# Patient Record
Sex: Male | Born: 1945 | Race: White | Hispanic: No | Marital: Married | Smoking: Never smoker
Health system: Southern US, Community
[De-identification: ages and names within clinical notes are randomized; demographics above are authoritative.]

## PROBLEM LIST (undated history)

## (undated) DIAGNOSIS — Z8719 Personal history of other diseases of the digestive system: Secondary | ICD-10-CM

## (undated) DIAGNOSIS — M1990 Unspecified osteoarthritis, unspecified site: Secondary | ICD-10-CM

## (undated) DIAGNOSIS — N12 Tubulo-interstitial nephritis, not specified as acute or chronic: Secondary | ICD-10-CM

## (undated) DIAGNOSIS — N529 Male erectile dysfunction, unspecified: Secondary | ICD-10-CM

## (undated) DIAGNOSIS — N419 Inflammatory disease of prostate, unspecified: Secondary | ICD-10-CM

## (undated) DIAGNOSIS — Q245 Malformation of coronary vessels: Secondary | ICD-10-CM

## (undated) DIAGNOSIS — N486 Induration penis plastica: Secondary | ICD-10-CM

## (undated) DIAGNOSIS — E785 Hyperlipidemia, unspecified: Secondary | ICD-10-CM

## (undated) DIAGNOSIS — D126 Benign neoplasm of colon, unspecified: Secondary | ICD-10-CM

## (undated) HISTORY — PX: LAPAROTOMY: SHX154

## (undated) HISTORY — DX: Benign neoplasm of colon, unspecified: D12.6

## (undated) HISTORY — DX: Tubulo-interstitial nephritis, not specified as acute or chronic: N12

## (undated) HISTORY — DX: Malformation of coronary vessels: Q24.5

## (undated) HISTORY — DX: Inflammatory disease of prostate, unspecified: N41.9

## (undated) HISTORY — PX: TONSILLECTOMY: SUR1361

## (undated) HISTORY — DX: Unspecified osteoarthritis, unspecified site: M19.90

## (undated) HISTORY — DX: Personal history of other diseases of the digestive system: Z87.19

## (undated) HISTORY — PX: OTHER SURGICAL HISTORY: SHX169

## (undated) HISTORY — PX: POLYPECTOMY: SHX149

---

## 1993-04-30 DIAGNOSIS — Q245 Malformation of coronary vessels: Secondary | ICD-10-CM

## 1993-04-30 HISTORY — DX: Malformation of coronary vessels: Q24.5

## 2006-06-25 ENCOUNTER — Ambulatory Visit: Payer: Self-pay | Admitting: Internal Medicine

## 2006-06-25 LAB — CONVERTED CEMR LAB
ALT: 50 units/L — ABNORMAL HIGH (ref 0–40)
Alkaline Phosphatase: 53 units/L (ref 39–117)
Basophils Relative: 1.5 % — ABNORMAL HIGH (ref 0.0–1.0)
Bilirubin, Direct: 0.2 mg/dL (ref 0.0–0.3)
CO2: 30 meq/L (ref 19–32)
Creatinine, Ser: 1 mg/dL (ref 0.4–1.5)
Direct LDL: 123.1 mg/dL
Eosinophils Relative: 3.1 % (ref 0.0–5.0)
GFR calc Af Amer: 98 mL/min
Glucose, Bld: 98 mg/dL (ref 70–99)
HCT: 42 % (ref 39.0–52.0)
HDL: 68.9 mg/dL (ref >39.0)
Hemoglobin: 14.9 g/dL (ref 13.0–17.0)
Leukocytes, UA: NEGATIVE
Lymphocytes Relative: 25 % (ref 12.0–46.0)
Monocytes Absolute: 0.4 10*3/uL (ref 0.2–0.7)
Neutro Abs: 2.5 10*3/uL (ref 1.4–7.7)
Nitrite: NEGATIVE
Potassium: 4.3 meq/L (ref 3.5–5.1)
RDW: 12.4 % (ref 11.5–14.6)
Specific Gravity, Urine: >=1.03 (ref 1.000–1.03)
TSH: 1.3 microintl units/mL (ref 0.35–5.50)
Total Bilirubin: 1.2 mg/dL (ref 0.3–1.2)
Total Protein, Urine: NEGATIVE mg/dL
Total Protein: 6.6 g/dL (ref 6.0–8.3)
Urobilinogen, UA: 0.2 (ref 0.0–1.0)
WBC: 4.1 10*3/uL — ABNORMAL LOW (ref 4.5–10.5)
pH: 5.5 (ref 5.0–8.0)

## 2006-07-02 ENCOUNTER — Ambulatory Visit: Payer: Self-pay | Admitting: Internal Medicine

## 2006-07-16 ENCOUNTER — Ambulatory Visit: Payer: Self-pay | Admitting: Internal Medicine

## 2006-07-30 ENCOUNTER — Ambulatory Visit: Payer: Self-pay | Admitting: Internal Medicine

## 2007-09-06 ENCOUNTER — Encounter: Payer: Self-pay | Admitting: *Deleted

## 2007-09-06 DIAGNOSIS — Z860100 Personal history of colon polyps, unspecified: Secondary | ICD-10-CM | POA: Insufficient documentation

## 2007-09-06 DIAGNOSIS — K5730 Diverticulosis of large intestine without perforation or abscess without bleeding: Secondary | ICD-10-CM | POA: Insufficient documentation

## 2007-09-06 DIAGNOSIS — K648 Other hemorrhoids: Secondary | ICD-10-CM | POA: Insufficient documentation

## 2007-09-06 DIAGNOSIS — Z87448 Personal history of other diseases of urinary system: Secondary | ICD-10-CM | POA: Insufficient documentation

## 2007-09-06 DIAGNOSIS — Z9189 Other specified personal risk factors, not elsewhere classified: Secondary | ICD-10-CM | POA: Insufficient documentation

## 2008-08-10 ENCOUNTER — Telehealth: Payer: Self-pay | Admitting: Internal Medicine

## 2008-08-10 ENCOUNTER — Ambulatory Visit: Payer: Self-pay | Admitting: Internal Medicine

## 2009-01-04 ENCOUNTER — Ambulatory Visit: Payer: Self-pay | Admitting: Internal Medicine

## 2009-01-04 LAB — CONVERTED CEMR LAB
ALT: 46 units/L (ref 0–53)
AST: 31 units/L (ref 0–37)
Albumin: 4.1 g/dL (ref 3.5–5.2)
BUN: 12 mg/dL (ref 6–23)
Basophils Relative: 0.2 % (ref 0.0–3.0)
Bilirubin Urine: NEGATIVE
Chloride: 106 meq/L (ref 96–112)
Eosinophils Relative: 2.8 % (ref 0.0–5.0)
GFR calc non Af Amer: 80.48 mL/min (ref >60)
HCT: 44.6 % (ref 39.0–52.0)
HDL: 65.2 mg/dL (ref >39.00)
Hemoglobin, Urine: NEGATIVE
Hemoglobin: 15.3 g/dL (ref 13.0–17.0)
Ketones, ur: NEGATIVE mg/dL
Leukocytes, UA: NEGATIVE
Lymphs Abs: 1.2 10*3/uL (ref 0.7–4.0)
MCV: 97.8 fL (ref 78.0–100.0)
Monocytes Absolute: 0.3 10*3/uL (ref 0.1–1.0)
Monocytes Relative: 8.4 % (ref 3.0–12.0)
Neutro Abs: 2.4 10*3/uL (ref 1.4–7.7)
Potassium: 4.3 meq/L (ref 3.5–5.1)
Sodium: 141 meq/L (ref 135–145)
TSH: 1.31 microintl units/mL (ref 0.35–5.50)
Total Bilirubin: 1 mg/dL (ref 0.3–1.2)
Total Protein: 7.1 g/dL (ref 6.0–8.3)
VLDL: 20 mg/dL (ref 0.0–40.0)
WBC: 4 10*3/uL — ABNORMAL LOW (ref 4.5–10.5)
pH: 5.5 (ref 5.0–8.0)

## 2009-01-14 ENCOUNTER — Ambulatory Visit: Payer: Self-pay | Admitting: Internal Medicine

## 2010-08-21 ENCOUNTER — Ambulatory Visit (INDEPENDENT_AMBULATORY_CARE_PROVIDER_SITE_OTHER): Payer: BC Managed Care – PPO | Admitting: Internal Medicine

## 2010-08-21 ENCOUNTER — Encounter: Payer: Self-pay | Admitting: Internal Medicine

## 2010-08-21 VITALS — BP 120/74 | HR 55 | Temp 97.9°F | Ht 71.0 in | Wt 184.0 lb

## 2010-08-21 DIAGNOSIS — R309 Painful micturition, unspecified: Secondary | ICD-10-CM

## 2010-08-21 DIAGNOSIS — Z0000 Encounter for general adult medical examination without abnormal findings: Secondary | ICD-10-CM

## 2010-08-21 DIAGNOSIS — R300 Dysuria: Secondary | ICD-10-CM

## 2010-08-21 LAB — POCT URINALYSIS DIPSTICK
Bilirubin, UA: NEGATIVE
Ketones, UA: NEGATIVE
Leukocytes, UA: NEGATIVE
Spec Grav, UA: 1.025
pH, UA: 5

## 2010-08-21 NOTE — Progress Notes (Signed)
  Subjective:    Patient ID: John Trujillo, male    DOB: 12-08-1945, 65 y.o.   MRN: [id number]  HPIPt reports that last Tuesday he had on-set of fever, chills, dysuria, malaise. He has a h/o UTI's in the past. He took lots of fluids, ASA and went to bed. His symptoms improved with resolution of fever. He has continued to have dysuria, urinary frequency, decreased force of stream. He denies hematuria, groin pain, perineal pain, low back pain. No prior problems with BPH. No prior h/o prostatitis.     Review of Systems Review of Systems  Constitutional:  Negative for fever, chills, activity change and unexpected weight change.  HENT:  Negative for hearing loss, ear pain, congestion, neck stiffness and postnasal drip.   Eyes: Negative for pain, discharge and visual disturbance.  Respiratory: Negative for chest tightness and wheezing.   Cardiovascular: Negative for chest pain and palpitations.       [No decreased exercise tolerance Gastrointestinal: [No change in bowel habit. No bloating or gas. No reflux or indigestion Genitourinary: Positive for urgency, frequency,slow stream and nocturia. Negative for flank pain, groin pain, perineal pain or difficulty urinating.  Musculoskeletal: Negative for myalgias, back pain, arthralgias and gait problem.  Neurological: Negative for dizziness, tremors, weakness and headaches.  Hematological: Negative for adenopathy.  Psychiatric/Behavioral: Negative for behavioral problems and dysphoric mood.       Objective:   Physical Exam WNWD white male Back - no CVAT  Abd - no tenderness in groins or suprapubic region       Assessment & Plan:  1. Dysuria - Dip U/A negative. Suspect patient to have viral prostatitis.  Plan - His symptoms are already abating. Rx for STMP/SMX DS bid for 10 days provided for him to fill if his symptoms do not continue to resolve.  2. ED - mild ED  Plan - testosterone level at next lab draw           Sample of viagra  provided.
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
## 2010-10-10 ENCOUNTER — Other Ambulatory Visit (INDEPENDENT_AMBULATORY_CARE_PROVIDER_SITE_OTHER): Payer: BC Managed Care – PPO

## 2010-10-10 ENCOUNTER — Other Ambulatory Visit: Payer: Self-pay | Admitting: Internal Medicine

## 2010-10-10 DIAGNOSIS — Z0000 Encounter for general adult medical examination without abnormal findings: Secondary | ICD-10-CM

## 2010-10-10 LAB — CBC WITH DIFFERENTIAL/PLATELET
Basophils Relative: 1.1 % (ref 0.0–3.0)
Eosinophils Relative: 2.7 % (ref 0.0–5.0)
HCT: 41.2 % (ref 39.0–52.0)
Lymphs Abs: 1.3 10*3/uL (ref 0.7–4.0)
MCV: 95.2 fl (ref 78.0–100.0)
Monocytes Absolute: 0.4 10*3/uL (ref 0.1–1.0)
RBC: 4.33 Mil/uL (ref 4.22–5.81)
WBC: 4.1 10*3/uL — ABNORMAL LOW (ref 4.5–10.5)

## 2010-10-10 LAB — LIPID PANEL: Total CHOL/HDL Ratio: 3

## 2010-10-10 LAB — COMPREHENSIVE METABOLIC PANEL
ALT: 31 U/L (ref 0–53)
CO2: 28 mEq/L (ref 19–32)
Calcium: 9.3 mg/dL (ref 8.4–10.5)
Chloride: 103 mEq/L (ref 96–112)
GFR: 96.54 mL/min (ref >60.00)
Sodium: 135 mEq/L (ref 135–145)
Total Bilirubin: 0.7 mg/dL (ref 0.3–1.2)
Total Protein: 6.8 g/dL (ref 6.0–8.3)

## 2010-10-10 LAB — HEPATIC FUNCTION PANEL
ALT: 31 U/L (ref 0–53)
Albumin: 4.2 g/dL (ref 3.5–5.2)
Alkaline Phosphatase: 53 U/L (ref 39–117)
Total Protein: 6.8 g/dL (ref 6.0–8.3)

## 2010-10-11 LAB — TESTOSTERONE, FREE, TOTAL, SHBG: Testosterone, Free: 77 pg/mL (ref 47.0–244.0)

## 2010-10-17 ENCOUNTER — Encounter: Payer: Self-pay | Admitting: Internal Medicine

## 2010-10-18 ENCOUNTER — Ambulatory Visit (INDEPENDENT_AMBULATORY_CARE_PROVIDER_SITE_OTHER): Payer: BC Managed Care – PPO | Admitting: Internal Medicine

## 2010-10-18 VITALS — BP 132/80 | HR 55 | Temp 98.4°F | Wt 182.0 lb

## 2010-10-18 DIAGNOSIS — Z0000 Encounter for general adult medical examination without abnormal findings: Secondary | ICD-10-CM

## 2010-10-18 DIAGNOSIS — Z136 Encounter for screening for cardiovascular disorders: Secondary | ICD-10-CM

## 2010-10-18 MED ORDER — SILDENAFIL CITRATE 50 MG PO TABS: 50.0000 mg | ORAL_TABLET | ORAL | Status: DC | PRN

## 2010-10-19 ENCOUNTER — Encounter: Payer: Self-pay | Admitting: Internal Medicine

## 2010-10-19 NOTE — Progress Notes (Signed)
Subjective:    Patient ID: John Trujillo, male    DOB: [birth year], 65 y.o.   MRN: [id number]  HPI The patient is here for annual  wellness examination and management of other chronic and acute problems. He does complain of left hip pain but this does not limit his activities. He currently takes no treatment   The risk factors are reflected in the social history.  The roster of all physicians providing medical care to patient - is listed in the Snapshot section of the chart.  Activities of daily living:  The patient is 100% inedpendent in all ADLs: dressing, toileting, feeding as well as independent mobility  Home safety : The patient has smoke detectors in the home. They wear seatbelts. No firearms at home. There is no violence in the home.   There is no risks for hepatitis, STDs or HIV. There is no   history of blood transfusion. They have no travel history to infectious disease endemic areas of the world.  The patient has seen their dentist in the last six month. They have not seen their eye doctor in the last year. They deny any hearing difficulty and have not had audiologic testing in the last year.  They do have excessive sun exposure. Discussed the need for sun protection: hats, long sleeves and use of sunscreen if there is significant sun exposure. He does wear sunscreen while playing golf.  Diet: the importance of a healthy diet is discussed. They do have a healthy diet.  The patient has a regular exercise program: gold , 3 per week.  The benefits of regular aerobic exercise were discussed.  Depression screen: there are no signs or vegative symptoms of depression- irritability, change in appetite, anhedonia, sadness/tearfullness.  Cognitive assessment: the patient manages all their financial and personal affairs and is actively engaged.   The following portions of the patient's history were reviewed and updated as appropriate: allergies, current medications, past family  history, past medical history,  past surgical history, past social history  and problem list.  Vision, hearing, body mass index were assessed and reviewed.   During the course of the visit the patient was educated and counseled about appropriate screening and preventive services including : fall prevention , diabetes screening, nutrition counseling, colorectal cancer screening, and recommended immunizations.  Past Medical History  Diagnosis Date  . Pyelonephritis     history of urinary retention  . Prostatitis   . Tubulovillous adenoma of colon   . History of hemorrhoids    Past Surgical History  Procedure Date  . Mini laparotomy for variceal repair   . History of bezoar removed from back pre-malignant   . Laparotomy     for intestinal obstruction as a child  . Polypectomy   . Tonsillectomy    Family History  Problem Relation Age of Onset  . Cancer Father     Prostate cancer   History   Social History  . Marital Status: Married    Spouse Name: N/A    Number of Children: 2  . Years of Education: 18   Occupational History  . executive     retired   Social History Main Topics  . Smoking status: Never Smoker   . Smokeless tobacco: Never Used  . Alcohol Use: Yes  . Drug Use: No  . Sexually Active: Yes -- Male partner(s)   Other Topics Concern  . Not on file   Social History Narrative   [employer],  [place]-  business and [employer]. Married-'76. 1 son- '83 (lives at home); 1 Daughter- '80-married, PhD. Work - Retired Psychologist, educational from [place] '06 enjoys retirement and exercise.Wife - in good health, marriage is in good health.      Review of Systems Review of Systems  Constitutional:  Negative for fever, chills, activity change and unexpected weight change.  HEENT:  Negative for hearing loss, ear pain, congestion, neck stiffness and postnasal drip. Negative for sore throat or swallowing problems. Negative for dental complaints.   Eyes: Negative for vision loss or change  in visual acuity.  Respiratory: Negative for chest tightness and wheezing.   Cardiovascular: Negative for chest pain and palpitationNo decreased exercise tolerance Gastrointestinal: No change in bowel habit. No bloating or gas. No reflux or indigestion Genitourinary: Negative for urgency, frequency, flank pain and difficulty urinating.  Musculoskeletal: Negative for myalgias, back pain, arthralgias and gait problem.  Neurological: Negative for dizziness, tremors, weakness and headaches.  Hematological: Negative for adenopathy.  Psychiatric/Behavioral: Negative for behavioral problems and dysphoric mood.       Objective:   Physical Exam Vital signs reviewed Gen'l: Well nourished well developed     male in no acute distress  HEENT:  Head: Normocephalic and atraumatic.  Right Ear: External ear normal. EAC/TM nl Left Ear: External ear normal.  EAC/TM nl Nose: Nose normal.  Mouth/Throat: Oropharynx is clear and moist. Dentition - native, in good repair. No buccal or palatal lesions. Posterior pharynx clear. Eyes: Conjunctivae and sclera clear. EOM intact. Pupils are equal, round, and reactive to light. Right eye exhibits no discharge. Left eye exhibits no discharge. Neck: Normal range of motion. Neck supple. No JVD present. No tracheal deviation present. No thyromegaly present.  Cardiovascular: Normal rate, regular rhythm, no gallop, no friction rub, no murmur heard.      Quiet precordium. 2+ radial and DP pulses . No carotid bruits Pulmonary/Chest: Effort normal. No respiratory distress or increased WOB, no wheezes, no rales. No chest wall deformity or CVAT. Abdominal: Soft. Bowel sounds are normal in all quadrants. He exhibits no distension, no tenderness, no rebound or guarding, No heptosplenomegaly  Genitourinary:  rectal exam: Normal sphincter tone, prostate is normal in size, no nodules or masses or asymmetry. Musculoskeletal: Normal range of motion. He exhibits no edema and no  tenderness.       Small and large joints without redness, synovial thickening or deformity. Full range of motion preserved about all small, median and large joints.  Lymphadenopathy:    He has no cervical or supraclavicular adenopathy.  Neurological: He is alert and oriented to person, place, and time. CN II-XII intact. DTRs 2+ and symmetrical biceps, radial and patellar tendons. Cerebellar function normal with no tremor, rigidity, normal gait and station.  Skin: Skin is warm and dry. No rash noted. No erythema.  Psychiatric: He has a normal mood and affect. His behavior is normal. Thought content normal.   Lab Results  Component Value Date   WBC 4.1* 10/10/2010   HGB 14.3 10/10/2010   HCT 41.2 10/10/2010   PLT 196.0 10/10/2010   CHOL 209* 10/10/2010   TRIG 91.0 10/10/2010   HDL 64.30 10/10/2010   LDLDIRECT 116.2 10/10/2010   ALT 31 10/10/2010   ALT 31 10/10/2010   AST 29 10/10/2010   AST 29 10/10/2010   NA 135 10/10/2010   K 4.1 10/10/2010   CL 103 10/10/2010   CREATININE 0.9 10/10/2010   BUN 23 10/10/2010   CO2 28 10/10/2010   TSH 1.33 10/10/2010  PSA 1.63 10/10/2010            Assessment & Plan:

## 2011-11-21 ENCOUNTER — Other Ambulatory Visit: Payer: Self-pay

## 2011-11-21 MED ORDER — SILDENAFIL CITRATE 50 MG PO TABS: 50.0000 mg | ORAL_TABLET | ORAL | Status: DC | PRN

## 2012-06-25 ENCOUNTER — Encounter: Payer: Self-pay | Admitting: Internal Medicine

## 2012-06-25 ENCOUNTER — Ambulatory Visit (INDEPENDENT_AMBULATORY_CARE_PROVIDER_SITE_OTHER): Payer: Medicare Other | Admitting: Internal Medicine

## 2012-06-25 VITALS — BP 110/80 | HR 56 | Temp 97.1°F | Wt 183.1 lb

## 2012-06-25 DIAGNOSIS — R05 Cough: Secondary | ICD-10-CM

## 2012-06-25 DIAGNOSIS — R058 Other specified cough: Secondary | ICD-10-CM

## 2012-06-25 DIAGNOSIS — R059 Cough, unspecified: Secondary | ICD-10-CM

## 2012-06-25 MED ORDER — OMEPRAZOLE 20 MG PO CPDR: 20.0000 mg | DELAYED_RELEASE_CAPSULE | Freq: Every day | ORAL | Status: DC

## 2012-06-25 MED ORDER — CETIRIZINE HCL 10 MG PO TABS: 10.0000 mg | ORAL_TABLET | Freq: Every day | ORAL | Status: DC

## 2012-06-25 MED ORDER — PHENYLEPH-PROMETHAZINE-COD 5-6.25-10 MG/5ML PO SYRP: 5.0000 mL | ORAL_SOLUTION | Freq: Every day | ORAL | Status: DC

## 2012-06-25 NOTE — Patient Instructions (Signed)
It was good to see you today. We have reviewed your prior records including labs and tests today If you develop worsening symptoms or fever, call and we can reconsider antibiotics, but it does not appear necessary to use antibiotics at this time. Use promethazine DM plus codeine syrup daily at night for 2 weeks and every 6 hours as needed for cough Take Zyrtec once daily for 2 weeks, then as needed for cough or congestion Take omeprazole ( Prilosec) 20 mg once daily for 2 weeks, then as needed for cough and for indigestion Your prescription(s) have been submitted to your pharmacy. Please take as directed and contact our office if you believe you are having problem(s) with the medication(s). Call if symptoms worse or unimproved in the next 3 weeks to further evaluation as needed

## 2012-06-25 NOTE — Progress Notes (Signed)
Subjective:    Patient ID: John Trujillo, male    DOB: 19-Jun-1945, 67 y.o.   MRN: [id number]  Cough This is a new problem. The current episode started more than 1 month ago. The problem has been unchanged. The problem occurs hourly. The cough is non-productive. Associated symptoms include heartburn, nasal congestion and postnasal drip. Pertinent negatives include no chills, ear congestion, fever, headaches, hemoptysis, myalgias, rash, rhinorrhea, sore throat, shortness of breath, weight loss or wheezing. The symptoms are aggravated by lying down. He has tried prescription cough suppressant for the symptoms. The treatment provided moderate relief. His past medical history is significant for bronchitis. There is no history of asthma, COPD, emphysema, environmental allergies or pneumonia.   Past Medical History  Diagnosis Date  . Pyelonephritis     history of urinary retention  . Prostatitis   . Tubulovillous adenoma of colon   . History of hemorrhoids     Review of Systems  Constitutional: Negative for fever, chills and weight loss.  HENT: Positive for postnasal drip. Negative for sore throat and rhinorrhea.   Respiratory: Positive for cough. Negative for hemoptysis, shortness of breath and wheezing.   Gastrointestinal: Positive for heartburn.  Musculoskeletal: Negative for myalgias.  Skin: Negative for rash.  Allergic/Immunologic: Negative for environmental allergies.  Neurological: Negative for headaches.       Objective:   Physical Exam BP 110/80  Pulse 56  Temp(Src) 97.1 F (36.2 C) (Oral)  Wt 183 lb 1.9 oz (83.063 kg)  BMI 25.55 kg/m2  SpO2 98% Wt Readings from Last 3 Encounters:  06/25/12 183 lb 1.9 oz (83.063 kg)  10/18/10 182 lb (82.555 kg)  08/21/10 184 lb (83.462 kg)   Constitutional:  He appears well-developed and well-nourished. No distress.  Neck: Normal range of motion. Neck supple. No JVD present. No thyromegaly present.  Cardiovascular: Normal rate,  regular rhythm and normal heart sounds.  No murmur heard. no BLE edema Pulmonary/Chest: Effort normal and breath sounds normal. No respiratory distress. no wheezes.  Abdominal: Soft. Bowel sounds are normal. Patient exhibits no distension. There is no tenderness.  Musculoskeletal: Normal range of motion. Patient exhibits no edema.  Neurological: he is alert and oriented to person, place, and time. No cranial nerve deficit. Coordination normal.  Skin: Skin is warm and dry.  No erythema or ulceration.  Psychiatric: he has a normal mood and affect. behavior is normal. Judgment and thought content normal.   Lab Results  Component Value Date   WBC 4.1* 10/10/2010   HGB 14.3 10/10/2010   HCT 41.2 10/10/2010   PLT 196.0 10/10/2010   GLUCOSE 89 10/10/2010   CHOL 209* 10/10/2010   TRIG 91.0 10/10/2010   HDL 64.30 10/10/2010   LDLDIRECT 116.2 10/10/2010   ALT 31 10/10/2010   ALT 31 10/10/2010   AST 29 10/10/2010   AST 29 10/10/2010   NA 135 10/10/2010   K 4.1 10/10/2010   CL 103 10/10/2010   CREATININE 0.9 10/10/2010   BUN 23 10/10/2010   CO2 28 10/10/2010   TSH 1.33 10/10/2010   PSA 1.63 10/10/2010      Assessment & Plan:   Postviral cough x1 month cyclical cough pattern Sinus congestion but no evidence for sinusitis  Hold antibiotics and has no evidence for infection Treat with promethazine DM and codeine for cough suppression PPI daily x2 weeks to control reflux component cough Daily antihistamine to help with nasal and sinus symptoms daily for 2 weeks, then as needed  Patient to  call if symptoms worse or unimproved

## 2012-10-29 ENCOUNTER — Other Ambulatory Visit: Payer: Medicare Other

## 2012-11-10 ENCOUNTER — Encounter: Payer: Self-pay | Admitting: Internal Medicine

## 2012-11-10 ENCOUNTER — Ambulatory Visit (INDEPENDENT_AMBULATORY_CARE_PROVIDER_SITE_OTHER): Payer: Medicare Other | Admitting: Internal Medicine

## 2012-11-10 ENCOUNTER — Other Ambulatory Visit (INDEPENDENT_AMBULATORY_CARE_PROVIDER_SITE_OTHER): Payer: Medicare Other

## 2012-11-10 VITALS — BP 140/72 | HR 46 | Temp 97.4°F | Resp 12 | Ht 71.0 in | Wt 183.8 lb

## 2012-11-10 DIAGNOSIS — R7989 Other specified abnormal findings of blood chemistry: Secondary | ICD-10-CM

## 2012-11-10 DIAGNOSIS — Z0000 Encounter for general adult medical examination without abnormal findings: Secondary | ICD-10-CM

## 2012-11-10 DIAGNOSIS — K648 Other hemorrhoids: Secondary | ICD-10-CM

## 2012-11-10 DIAGNOSIS — Z125 Encounter for screening for malignant neoplasm of prostate: Secondary | ICD-10-CM

## 2012-11-10 LAB — LIPID PANEL
HDL: 64.7 mg/dL (ref >39.00)
VLDL: 25 mg/dL (ref 0.0–40.0)

## 2012-11-10 LAB — COMPREHENSIVE METABOLIC PANEL
AST: 25 U/L (ref 0–37)
Albumin: 4.2 g/dL (ref 3.5–5.2)
Alkaline Phosphatase: 56 U/L (ref 39–117)
Glucose, Bld: 84 mg/dL (ref 70–99)
Potassium: 4.9 mEq/L (ref 3.5–5.1)
Sodium: 136 mEq/L (ref 135–145)
Total Protein: 7 g/dL (ref 6.0–8.3)

## 2012-11-10 NOTE — Progress Notes (Signed)
Subjective:    Patient ID: John Trujillo, male    DOB: [birth year], 67 y.o.   MRN: [id number]  HPI The patient is here for their Welcome Medicare wellness examination and management of other chronic and acute problems.  He is feeling well and doing well. He had a viral illness in Feb '14, seen by Dr. Felicity Coyer and he did well.  He does have a constant clearing of his throat. No hoarseness or change in phonation. His symptoms were better at the beach.    The risk factors are reflected in the social history.  The roster of all physicians providing medical care to patient - is listed in the Snapshot section of the chart.  Activities of daily living:  The patient is 100% inedpendent in all ADLs: dressing, toileting, feeding as well as independent mobility  Home safety : The patient has smoke detectors in the home. Falls - none.Home is fall safe.  They wear seatbelts. No firearms at home. There is no violence in the home.   There is no risks for hepatitis, STDs or HIV. There is no history of blood transfusion. They have no travel history to infectious disease endemic areas of the world.  The patient has seen their dentist in the last 12 months. They have not seen their eye doctor in the last year. They deny  any hearing difficulty and have not had audiologic testing in the last year.    They do not  have excessive sun exposure. Discussed the need for sun protection: hats, long sleeves and use of sunscreen if there is significant sun exposure.   Diet: the importance of a healthy diet is discussed. They do have a healthy diet.  The patient has a regular exercise program: golf - not going to the gyn , 60 duration, 3 per week.  The benefits of regular aerobic exercise were discussed.  Depression screen: there are no signs or vegative symptoms of depression- irritability, change in appetite, anhedonia, sadness/tearfullness.  Cognitive assessment: the patient manages all their financial and  personal affairs and is actively engaged.   The following portions of the patient's history were reviewed and updated as appropriate: allergies, current medications, past family history, past medical history,  past surgical history, past social history  and problem list.  Vision, hearing, body mass index were assessed and reviewed.   During the course of the visit the patient was educated and counseled about appropriate screening and preventive services including : fall prevention , diabetes screening, nutrition counseling, colorectal cancer screening, and recommended immunizations.  Past Medical History  Diagnosis Date  . Pyelonephritis     history of urinary retention  . Prostatitis   . Tubulovillous adenoma of colon   . History of hemorrhoids   . 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 Mini laparotomy for variceal repair    . History of bezoar removed from back pre-malignant    . Laparotomy      for intestinal obstruction as a child  . Polypectomy    . Tonsillectomy     Family History  Problem Relation Age of Onset  . Cancer Father     Prostate cancer   History   Social History  . Marital Status: Married    Spouse Name: N/A    Number of Children: 2  . Years of Education: 18   Occupational History  .  executive     retired   Social History Main Topics  . Smoking status: Never Smoker   . Smokeless tobacco: Never Used  . Alcohol Use: Yes  . Drug Use: No  . Sexually Active: Yes -- Male partner(s)   Other Topics Concern  . Not on file   Social History Narrative   [employer],  [employer] business and [employer]. Married-'76. 1 son- '83 (lives at home); 1 Daughter- '80-married, PhD. 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 She has  to have two stents placed by Delane Ginger '14. She is making a good recovery. marriage is in good health.             Current Outpatient Prescriptions on File Prior to Visit  Medication Sig Dispense Refill  . aspirin 81 MG tablet Take 81 mg by mouth daily.        . cetirizine (ZYRTEC) 10 MG tablet Take 1 tablet (10 mg total) by mouth daily. X 2 weeks, then as needed for congestion/cough  30 tablet  11  . MULTIPLE VITAMINS PO Take by mouth.        Marland Kitchen omeprazole (PRILOSEC) 20 MG capsule Take 1 capsule (20 mg total) by mouth daily. X 2 weeks, then as needed for reflux/indigestion or cough  30 capsule  3  . Phenyleph-Promethazine-Cod (PROMETHAZINE VC/CODEINE) 5-6.25-10 MG/5ML SYRP Take 5 mLs by mouth at bedtime. X 2 weeks, and every 6 hours as needed for cough  240 mL  0  . sildenafil (VIAGRA) 50 MG tablet Take 1 tablet (50 mg total) by mouth as needed for erectile dysfunction.  6 tablet  2   No current facility-administered medications on file prior to visit.      Review of Systems Constitutional:  Negative for fever, chills, activity change and unexpected weight change.  HEENT:  Negative for hearing loss, ear pain, congestion, neck stiffness and postnasal drip. Negative for sore throat or swallowing problems. Negative for dental complaints.   Eyes: Negative for vision loss or change in visual acuity.  Respiratory: Negative for chest tightness and wheezing. Negative for DOE.   Cardiovascular: Negative for chest pain or palpitations. No decreased exercise tolerance Gastrointestinal: No change in bowel habit. No bloating or gas. No reflux or indigestion Genitourinary: Negative for urgency, frequency, flank pain and difficulty urinating.  Musculoskeletal: Negative for myalgias, back pain, arthralgias and gait problem.  Neurological: Negative for dizziness, tremors, weakness and headaches.  Hematological: Negative for adenopathy.  Psychiatric/Behavioral: Negative for behavioral problems and  dysphoric mood.        Objective:   Physical Exam Filed Vitals:   11/10/12 1504  BP: 140/72  Pulse: 46  Temp: 97.4 F (36.3 C)  Resp: 12   Wt Readings from Last 3 Encounters:  11/10/12 183 lb 12.8 oz (83.371 kg)  06/25/12 183 lb 1.9 oz (83.063 kg)  10/18/10 182 lb (82.555 kg)   Gen'l: Well nourished well develop white male in no acute distress  HEENT: Head: Normocephalic and atraumatic. Right Ear: External ear normal. EAC/TM nl. Left Ear: External ear normal.  EAC/TM nl. Nose: Nose normal. Mouth/Throat: Oropharynx is clear and moist. Dentition - native, in good repair. No buccal or palatal lesions. Posterior pharynx clear. Eyes: Conjunctivae and sclera clear. EOM intact. Pupils are equal, round, and reactive to light. Right eye exhibits no discharge. Left  eye exhibits no discharge. Neck: Normal range of motion. Neck supple. No JVD present. No tracheal deviation present. No thyromegaly present.  Cardiovascular: Normal rate, regular rhythm, no gallop, no friction rub, no murmur heard.      Quiet precordium. 2+ radial and DP pulses . No carotid bruits Pulmonary/Chest: Effort normal. No respiratory distress or increased WOB, no wheezes, no rales. No chest wall deformity or CVAT. Abdomen: Soft. Bowel sounds are normal in all quadrants. He exhibits no distension, no tenderness, no rebound or guarding, No heptosplenomegaly  Genitourinary:  deferred Musculoskeletal: Normal range of motion. He exhibits no edema and no tenderness.       Small and large joints without redness, synovial thickening or deformity. Full range of motion preserved about all small, median and large joints.  Lymphadenopathy:    He has no cervical or supraclavicular adenopathy.  Neurological: He is alert and oriented to person, place, and time. CN II-XII intact. DTRs 2+ and symmetrical biceps, radial and patellar tendons. Cerebellar function normal with no tremor, rigidity, normal gait and station.  Skin: Skin is warm and  dry. No rash noted. No erythema.  Psychiatric: He has a normal mood and affect. His behavior is normal. Thought content normal.   Recent Results (from the past 2160 hour(s))  LIPID PANEL     Status: Abnormal   Collection Time    11/10/12  4:16 PM      Result Value Range   Cholesterol 226 (*) 0 - 200 mg/dL   Comment: ATP III Classification       Desirable:  < 200 mg/dL               Borderline High:  200 - 239 mg/dL          High:  > = 161 mg/dL   Triglycerides 096.0  0.0 - 149.0 mg/dL   Comment: Normal:  <454 mg/dLBorderline High:  150 - 199 mg/dL   HDL 09.81  >19.14 mg/dL   VLDL 78.2  0.0 - 95.6 mg/dL   Total CHOL/HDL Ratio 3     Comment:                Men          Women1/2 Average Risk     3.4          3.3Average Risk          5.0          4.42X Average Risk          9.6          7.13X Average Risk          15.0          11.0                      COMPREHENSIVE METABOLIC PANEL     Status: None   Collection Time    11/10/12  4:16 PM      Result Value Range   Sodium 136  135 - 145 mEq/L   Potassium 4.9  3.5 - 5.1 mEq/L   Chloride 103  96 - 112 mEq/L   CO2 28  19 - 32 mEq/L   Glucose, Bld 84  70 - 99 mg/dL   BUN 17  6 - 23 mg/dL   Creatinine, Ser 0.9  0.4 - 1.5 mg/dL   Total Bilirubin 1.0  0.3 - 1.2 mg/dL   Alkaline Phosphatase 56  39 - 117  U/L   AST 25  0 - 37 U/L   ALT 41  0 - 53 U/L   Total Protein 7.0  6.0 - 8.3 g/dL   Albumin 4.2  3.5 - 5.2 g/dL   Calcium 9.8  8.4 - 16.1 mg/dL   GFR 09.60  >45.40 mL/min  PSA     Status: None   Collection Time    11/10/12  4:16 PM      Result Value Range   PSA 1.49  0.10 - 4.00 ng/mL  LDL CHOLESTEROL, DIRECT     Status: None   Collection Time    11/10/12  4:16 PM      Result Value Range   Direct LDL 139.9     Comment: Optimal:  <100 mg/dLNear or Above Optimal:  100-129 mg/dLBorderline High:  130-159 mg/dLHigh:  160-189 mg/dLVery High:  >190 mg/dL           Assessment & Plan:

## 2012-11-10 NOTE — Patient Instructions (Addendum)
Welcome to [facility]!!  Your exam is normal, EKG slow but otherwise normal  Routine labs ordered and results will be on MyChart.  See you next year.

## 2012-11-11 NOTE — Assessment & Plan Note (Signed)
No complaints or reports of recurrent pain or bleeding

## 2012-11-11 NOTE — Assessment & Plan Note (Signed)
Interval history is unremarkable. Physical exam is normal - fit appearing man. Lab reveals LDL just above goal of 130 or greater, HDL (good cholesterol) is robust and much better than goal of 40+. There is not an indication for medical therapy but a prudent diet is recommended. He reports being current for colorectal cancer screening (will retrieve record). Discussed pros and cons of prostate cancer screening (USPHCTF recommendations reviewed and ACU April '13 recommendations) and he requests evaluation at this time. PSA normal at 1.39 - stable over the past several years. Immunizations are up to date. 12 lead EKG is normal with sinus bradycardia.  In summary A very nice man, happily retired, who appears to be medically stable and doing well. He will return in 1 year or sooner as needed.

## 2012-11-12 ENCOUNTER — Encounter: Payer: Self-pay | Admitting: Internal Medicine

## 2012-12-03 ENCOUNTER — Other Ambulatory Visit: Payer: Self-pay

## 2013-03-05 ENCOUNTER — Other Ambulatory Visit: Payer: Self-pay

## 2013-03-17 ENCOUNTER — Ambulatory Visit (INDEPENDENT_AMBULATORY_CARE_PROVIDER_SITE_OTHER): Payer: Medicare Other

## 2013-03-17 DIAGNOSIS — Z23 Encounter for immunization: Secondary | ICD-10-CM

## 2013-05-01 ENCOUNTER — Ambulatory Visit (INDEPENDENT_AMBULATORY_CARE_PROVIDER_SITE_OTHER): Payer: Medicare Other | Admitting: Family Medicine

## 2013-05-01 ENCOUNTER — Encounter: Payer: Self-pay | Admitting: Family Medicine

## 2013-05-01 VITALS — BP 148/90 | HR 82 | Temp 98.6°F | Wt 178.0 lb

## 2013-05-01 DIAGNOSIS — J209 Acute bronchitis, unspecified: Secondary | ICD-10-CM

## 2013-05-01 MED ORDER — AMOXICILLIN-POT CLAVULANATE 875-125 MG PO TABS: 1.0000 | ORAL_TABLET | Freq: Two times a day (BID) | ORAL | Status: DC

## 2013-05-01 NOTE — Progress Notes (Signed)
SUBJECTIVE:  John Trujillo is a 68 y.o. male who complains of congestion, sore throat, dry cough and myalgias for 21 days. He denies a history of chest pain, dizziness, fatigue, fevers, shortness of breath, sweats, vomiting and weight loss and denies a history of asthma. Patient denies smoke cigarettes. Positive sick contacts recently. No recent travel history.   OBJECTIVE: Blood pressure 148/90, pulse 82, temperature 98.6 F (37 C), temperature source Oral, weight 178 lb (80.74 kg), SpO2 96.00%.  He appears well, vital signs are as noted. Ears normal.  Throat and pharynx mild erythema with severe postnasal drip.  Neck supple with mild anterior cervical lymphadenopathy. No adenopathy in the neck. Nose is congested. Sinuses non tender. Pulmonary exam shows the patient has decreased breath sounds on the right lower lobe. Prevascular: Regular rhythm no murmur appreciated  ASSESSMENT:  bronchitis  PLAN: Symptomatic therapy suggested: push fluids, rest and return office visit prn if symptoms persist or worsen. Patient was given a prescription for Augmentin to cover for any type of bronchitis versus pneumonia. Discuss potential antihistamine for postnasal drip. Call or return to clinic prn if these symptoms worsen or fail to improve as anticipated.

## 2013-05-01 NOTE — Patient Instructions (Addendum)
Very nice to meet you Try augmentin twice daily Remember what your mom said about warm salt water gargles and teaspoon of honey before bed.  Consider a zyrtec nightly to help with the drainage for next 1-2 weeks.  Come back next week if not better. Bronchitis Bronchitis is the body's way of reacting to injury and/or infection (inflammation) of the bronchi. Bronchi are the air tubes that extend from the windpipe into the lungs. If the inflammation becomes severe, it may cause shortness of breath. CAUSES  Inflammation may be caused by:  A virus.  Germs (bacteria).  Dust.  Allergens.  Pollutants and many other irritants. The cells lining the bronchial tree are covered with tiny hairs (cilia). These constantly beat upward, away from the lungs, toward the mouth. This keeps the lungs free of pollutants. When these cells become too irritated and are unable to do their job, mucus begins to develop. This causes the characteristic cough of bronchitis. The cough clears the lungs when the cilia are unable to do their job. Without either of these protective mechanisms, the mucus would settle in the lungs. Then you would develop pneumonia. Smoking is a common cause of bronchitis and can contribute to pneumonia. Stopping this habit is the single most important thing you can do to help yourself. TREATMENT   Your caregiver may prescribe an antibiotic if the cough is caused by bacteria. Also, medicines that open up your airways make it easier to breathe. Your caregiver may also recommend or prescribe an expectorant. It will loosen the mucus to be coughed up. Only take over-the-counter or prescription medicines for pain, discomfort, or fever as directed by your caregiver.  Removing whatever causes the problem (smoking, for example) is critical to preventing the problem from getting worse.  Cough suppressants may be prescribed for relief of cough symptoms.  Inhaled medicines may be prescribed to help with  symptoms now and to help prevent problems from returning.  For those with recurrent (chronic) bronchitis, there may be a need for steroid medicines. SEEK IMMEDIATE MEDICAL CARE IF:   During treatment, you develop more pus-like mucus (purulent sputum).  You have a fever.  You become progressively more ill.  You have increased difficulty breathing, wheezing, or shortness of breath. It is necessary to seek immediate medical care if you are elderly or sick from any other disease. MAKE SURE YOU:   Understand these instructions.  Will watch your condition.  Will get help right away if you are not doing well or get worse. Document Released: 04/16/2005 Document Revised: 12/17/2012 Document Reviewed: 12/09/2012 [facility] Patient Information 2014 [place].

## 2013-05-01 NOTE — Progress Notes (Signed)
Pre-visit discussion using our clinic review tool. No additional management support is needed unless otherwise documented below in the visit note.  

## 2013-07-11 ENCOUNTER — Encounter: Payer: Self-pay | Admitting: Internal Medicine

## 2014-01-13 ENCOUNTER — Encounter: Payer: Self-pay | Admitting: Internal Medicine

## 2014-02-11 ENCOUNTER — Telehealth: Payer: Self-pay | Admitting: Internal Medicine

## 2014-02-11 NOTE — Telephone Encounter (Signed)
Pt would like to know if you will accept him? Wife is your pt. He has appt w/ Dr Doug Sou but does not want to see her. Will you accept him?

## 2014-02-11 NOTE — Telephone Encounter (Signed)
Pt aware.

## 2014-02-11 NOTE — Telephone Encounter (Signed)
Yes I can see him  ?

## 2014-03-19 ENCOUNTER — Encounter: Payer: Medicare Other | Admitting: Internal Medicine

## 2014-03-22 ENCOUNTER — Other Ambulatory Visit (INDEPENDENT_AMBULATORY_CARE_PROVIDER_SITE_OTHER): Payer: Medicare Other

## 2014-03-22 DIAGNOSIS — Z0000 Encounter for general adult medical examination without abnormal findings: Secondary | ICD-10-CM

## 2014-03-22 DIAGNOSIS — Z125 Encounter for screening for malignant neoplasm of prostate: Secondary | ICD-10-CM

## 2014-03-22 LAB — CBC WITH DIFFERENTIAL/PLATELET
BASOS ABS: 0.1 10*3/uL (ref 0.0–0.1)
Basophils Relative: 1.6 % (ref 0.0–3.0)
Eosinophils Absolute: 0.1 10*3/uL (ref 0.0–0.7)
Eosinophils Relative: 3.6 % (ref 0.0–5.0)
HEMATOCRIT: 44.7 % (ref 39.0–52.0)
Hemoglobin: 14.9 g/dL (ref 13.0–17.0)
LYMPHS ABS: 1.5 10*3/uL (ref 0.7–4.0)
Lymphocytes Relative: 36.6 % (ref 12.0–46.0)
MCHC: 33.2 g/dL (ref 30.0–36.0)
MCV: 94.6 fl (ref 78.0–100.0)
MONOS PCT: 10 % (ref 3.0–12.0)
Monocytes Absolute: 0.4 10*3/uL (ref 0.1–1.0)
Neutro Abs: 2 10*3/uL (ref 1.4–7.7)
Neutrophils Relative %: 48.2 % (ref 43.0–77.0)
PLATELETS: 210 10*3/uL (ref 150.0–400.0)
RBC: 4.73 Mil/uL (ref 4.22–5.81)
RDW: 13.8 % (ref 11.5–15.5)
WBC: 4.1 10*3/uL (ref 4.0–10.5)

## 2014-03-22 LAB — LIPID PANEL
CHOLESTEROL: 224 mg/dL — AB (ref 0–200)
HDL: 70.8 mg/dL (ref >39.00)
LDL Cholesterol: 133 mg/dL — ABNORMAL HIGH (ref 0–99)
NonHDL: 153.2
TRIGLYCERIDES: 101 mg/dL (ref 0.0–149.0)
Total CHOL/HDL Ratio: 3
VLDL: 20.2 mg/dL (ref 0.0–40.0)

## 2014-03-22 LAB — POCT URINALYSIS DIPSTICK
BILIRUBIN UA: NEGATIVE
Blood, UA: NEGATIVE
Glucose, UA: NEGATIVE
Ketones, UA: NEGATIVE
LEUKOCYTES UA: NEGATIVE
NITRITE UA: NEGATIVE
PH UA: 5
PROTEIN UA: NEGATIVE
Spec Grav, UA: <=1.005
UROBILINOGEN UA: 0.2

## 2014-03-22 LAB — BASIC METABOLIC PANEL
BUN: 19 mg/dL (ref 6–23)
CALCIUM: 9.8 mg/dL (ref 8.4–10.5)
CHLORIDE: 101 meq/L (ref 96–112)
CO2: 28 mEq/L (ref 19–32)
CREATININE: 0.9 mg/dL (ref 0.4–1.5)
GFR: 89.41 mL/min (ref >60.00)
Glucose, Bld: 90 mg/dL (ref 70–99)
Potassium: 4.5 mEq/L (ref 3.5–5.1)
Sodium: 137 mEq/L (ref 135–145)

## 2014-03-22 LAB — HEPATIC FUNCTION PANEL
ALBUMIN: 4.3 g/dL (ref 3.5–5.2)
ALK PHOS: 53 U/L (ref 39–117)
ALT: 31 U/L (ref 0–53)
AST: 25 U/L (ref 0–37)
Bilirubin, Direct: 0.2 mg/dL (ref 0.0–0.3)
TOTAL PROTEIN: 6.9 g/dL (ref 6.0–8.3)
Total Bilirubin: 0.9 mg/dL (ref 0.2–1.2)

## 2014-03-22 LAB — PSA: PSA: 1.53 ng/mL (ref 0.10–4.00)

## 2014-03-22 LAB — TSH: TSH: 2.22 u[IU]/mL (ref 0.35–4.50)

## 2014-03-30 ENCOUNTER — Ambulatory Visit (INDEPENDENT_AMBULATORY_CARE_PROVIDER_SITE_OTHER): Payer: Medicare Other | Admitting: Family Medicine

## 2014-03-30 ENCOUNTER — Encounter: Payer: Self-pay | Admitting: Family Medicine

## 2014-03-30 VITALS — BP 137/76 | HR 51 | Temp 98.7°F | Ht 71.0 in | Wt 182.0 lb

## 2014-03-30 DIAGNOSIS — Z0000 Encounter for general adult medical examination without abnormal findings: Secondary | ICD-10-CM

## 2014-03-30 NOTE — Progress Notes (Signed)
Pre visit review using our clinic review tool, if applicable. No additional management support is needed unless otherwise documented below in the visit note. 

## 2014-03-31 ENCOUNTER — Encounter: Payer: Self-pay | Admitting: Family Medicine

## 2014-03-31 NOTE — Progress Notes (Signed)
   Subjective:    Patient ID: John Trujillo, male    DOB: 1945-08-07, 68 y.o.   MRN: [id number]  HPI 68 yr old male for a cpx. He feels well. He is past due for another colonoscopy.    Review of Systems  Constitutional: Negative.   HENT: Negative.   Eyes: Negative.   Respiratory: Negative.   Cardiovascular: Negative.   Gastrointestinal: Negative.   Genitourinary: Negative.   Musculoskeletal: Negative.   Skin: Negative.   Neurological: Negative.   Psychiatric/Behavioral: Negative.        Objective:   Physical Exam  Constitutional: He is oriented to person, place, and time. He appears well-developed and well-nourished. No distress.  HENT:  Head: Normocephalic and atraumatic.  Right Ear: External ear normal.  Left Ear: External ear normal.  Nose: Nose normal.  Mouth/Throat: Oropharynx is clear and moist. No oropharyngeal exudate.  Eyes: Conjunctivae and EOM are normal. Pupils are equal, round, and reactive to light. Right eye exhibits no discharge. Left eye exhibits no discharge. No scleral icterus.  Neck: Neck supple. No JVD present. No tracheal deviation present. No thyromegaly present.  Cardiovascular: Normal rate, regular rhythm, normal heart sounds and intact distal pulses.  Exam reveals no gallop and no friction rub.   No murmur heard. EKG normal   Pulmonary/Chest: Effort normal and breath sounds normal. No respiratory distress. He has no wheezes. He has no rales. He exhibits no tenderness.  Abdominal: Soft. Bowel sounds are normal. He exhibits no distension and no mass. There is no tenderness. There is no rebound and no guarding.  Genitourinary: Rectum normal, prostate normal and penis normal. Guaiac negative stool. No penile tenderness.  Musculoskeletal: Normal range of motion. He exhibits no edema or tenderness.  Lymphadenopathy:    He has no cervical adenopathy.  Neurological: He is alert and oriented to person, place, and time. He has normal reflexes. No cranial  nerve deficit. He exhibits normal muscle tone. Coordination normal.  Skin: Skin is warm and dry. No rash noted. He is not diaphoretic. No erythema. No pallor.  Psychiatric: He has a normal mood and affect. His behavior is normal. Judgment and thought content normal.          Assessment & Plan:  Well exam. Set up a colonoscopy.

## 2014-04-02 ENCOUNTER — Encounter: Payer: Self-pay | Admitting: Internal Medicine

## 2014-05-20 ENCOUNTER — Ambulatory Visit (AMBULATORY_SURGERY_CENTER): Payer: Self-pay | Admitting: *Deleted

## 2014-05-20 VITALS — Ht 71.5 in | Wt 182.0 lb

## 2014-05-20 DIAGNOSIS — Z86010 Personal history of colonic polyps: Secondary | ICD-10-CM

## 2014-05-20 MED ORDER — MOVIPREP 100 G PO SOLR: 1.0000 | Freq: Once | ORAL | Status: DC

## 2014-05-20 NOTE — Progress Notes (Signed)
No egg or soy allergy. ewm No diet pills, no home 02 , no problems with past sedation. ewm Pt declined emmi video. ewm

## 2014-06-04 ENCOUNTER — Encounter: Payer: Self-pay | Admitting: Internal Medicine

## 2014-06-04 ENCOUNTER — Ambulatory Visit (AMBULATORY_SURGERY_CENTER): Payer: Medicare Other | Admitting: Internal Medicine

## 2014-06-04 VITALS — BP 152/95 | HR 60 | Temp 96.6°F | Resp 16 | Ht 71.5 in | Wt 182.0 lb

## 2014-06-04 DIAGNOSIS — D125 Benign neoplasm of sigmoid colon: Secondary | ICD-10-CM

## 2014-06-04 DIAGNOSIS — D122 Benign neoplasm of ascending colon: Secondary | ICD-10-CM

## 2014-06-04 DIAGNOSIS — Z86010 Personal history of colonic polyps: Secondary | ICD-10-CM

## 2014-06-04 HISTORY — PX: COLONOSCOPY: SHX174

## 2014-06-04 MED ORDER — SODIUM CHLORIDE 0.9 % IV SOLN: 500.0000 mL | INTRAVENOUS | Status: DC

## 2014-06-04 NOTE — Op Note (Signed)
[place]  [address] [place], [id number]   COLONOSCOPY PROCEDURE REPORT  PATIENT: John Trujillo, John Trujillo  MR#: [id number] BIRTHDATE: 08/21/1945 , 68  yrs. old GENDER: male ENDOSCOPIST: Lafayette Dragon, MD REFERRED EG:BTDVVOH Raymon Mutton, M.D. PROCEDURE DATE:  06/04/2014 PROCEDURE:   Colonoscopy with snare polypectomy and Colonoscopy with cold biopsy polypectomy First Screening Colonoscopy - Avg.  risk and is 50 yrs.  old or older - No.  Prior Negative Screening - Now for repeat screening. N/A  History of Adenoma - Now for follow-up colonoscopy & has been > or = to 3 yrs.  Yes hx of adenoma.  Has been 3 or more years since last colonoscopy.  Polyps Removed Today? Yes. ASA CLASS:   Class II INDICATIONS:history of tubular adenoma in 1999.  No polyps in 2002 or in April 2008. MEDICATIONS: Monitored anesthesia care and Propofol 300 mg IV  DESCRIPTION OF PROCEDURE:   After the risks benefits and alternatives of the procedure were thoroughly explained, informed consent was obtained.  The digital rectal exam revealed no abnormalities of the rectum.   The LB [id number]  endoscope was introduced through the anus and advanced to the cecum, which was identified by both the appendix and ileocecal valve. No adverse events experienced.   The quality of the prep was excellent, using MoviPrep  The instrument was then slowly withdrawn as the colon was fully examined.      COLON FINDINGS: Four polypoid shaped sessile polyps measuring 6 mm in size were found in the sigmoid colon and ascending colon.  A polypectomy was performed with a cold snare.  The resection was complete, the polyp tissue was completely retrieved and sent to histology.  A polypectomy was performed with cold forceps.  The resection was complete, the polyp tissue was completely retrieved and sent to histology.   There was moderate diverticulosis noted throughout the entire examined colon with associated  angulation and muscular hypertrophy.   Internal Grade I hemorrhoids were found. Retroflexed views revealed no abnormalities. The time to cecum=3 minutes 36 seconds.  Withdrawal time=25 minutes 410 seconds.  The scope was withdrawn and the procedure completed. COMPLICATIONS: There were no immediate complications.  ENDOSCOPIC IMPRESSION: 1.   Four sessile polyps were found in the sigmoid colon x1 and ascending colon x3,  polypectomy was performed with a cold snare x3 polypectomy was performed with cold forceps x1 2.   There was moderate diverticulosis noted throughout the entire examined colon 3.   Internal Grade I hemorrhoids  RECOMMENDATIONS: 1.  Await pathology results 2.  High-fiber diet Recall colonoscopy pending path report  eSigned:  Lafayette Dragon, MD 06/04/2014 9:23 AM   cc:   PATIENT NAME:  John Trujillo, John Trujillo MR#: [id number]

## 2014-06-04 NOTE — Progress Notes (Signed)
Report to PACU, RN, vss, BBS= Clear.  

## 2014-06-04 NOTE — Progress Notes (Signed)
Called to room to assist during endoscopic procedure.  Patient ID and intended procedure confirmed with present staff. Received instructions for my participation in the procedure from the performing physician.  

## 2014-06-04 NOTE — Patient Instructions (Signed)
Discharge instructions given. Handouts on polyps,diverticulosis and hemorrhoids. Resume previous medications. YOU HAD AN ENDOSCOPIC PROCEDURE TODAY AT [facility]: Refer to the procedure report that was given to you for any specific questions about what was found during the examination.  If the procedure report does not answer your questions, please call your gastroenterologist to clarify.  If you requested that your care partner not be given the details of your procedure findings, then the procedure report has been included in a sealed envelope for you to review at your convenience later.  YOU SHOULD EXPECT: Some feelings of bloating in the abdomen. Passage of more gas than usual.  Walking can help get rid of the air that was put into your GI tract during the procedure and reduce the bloating. If you had a lower endoscopy (such as a colonoscopy or flexible sigmoidoscopy) you may notice spotting of blood in your stool or on the toilet paper. If you underwent a bowel prep for your procedure, then you may not have a normal bowel movement for a few days.  DIET: Your first meal following the procedure should be a light meal and then it is ok to progress to your normal diet.  A half-sandwich or bowl of soup is an example of a good first meal.  Heavy or fried foods are harder to digest and may make you feel nauseous or bloated.  Likewise meals heavy in dairy and vegetables can cause extra gas to form and this can also increase the bloating.  Drink plenty of fluids but you should avoid alcoholic beverages for 24 hours.  ACTIVITY: Your care partner should take you home directly after the procedure.  You should plan to take it easy, moving slowly for the rest of the day.  You can resume normal activity the day after the procedure however you should NOT DRIVE or use heavy machinery for 24 hours (because of the sedation medicines used during the test).    SYMPTOMS TO REPORT IMMEDIATELY: A  gastroenterologist can be reached at any hour.  During normal business hours, 8:30 AM to 5:00 PM Monday through Friday, call [phone].  After hours and on weekends, please call the GI answering service at [phone] who will take a message and have the physician on call contact you.   Following lower endoscopy (colonoscopy or flexible sigmoidoscopy):  Excessive amounts of blood in the stool  Significant tenderness or worsening of abdominal pains  Swelling of the abdomen that is new, acute  Fever of 100F or higher  FOLLOW UP: If any biopsies were taken you will be contacted by phone or by letter within the next 1-3 weeks.  Call your gastroenterologist if you have not heard about the biopsies in 3 weeks.  Our staff will call the home number listed on your records the next business day following your procedure to check on you and address any questions or concerns that you may have at that time regarding the information given to you following your procedure. This is a courtesy call and so if there is no answer at the home number and we have not heard from you through the emergency physician on call, we will assume that you have returned to your regular daily activities without incident.  SIGNATURES/CONFIDENTIALITY: You and/or your care partner have signed paperwork which will be entered into your electronic medical record.  These signatures attest to the fact that that the information above on your After Visit Summary has been reviewed  and is understood.  Full responsibility of the confidentiality of this discharge information lies with you and/or your care-partner. 

## 2014-06-07 ENCOUNTER — Telehealth: Payer: Self-pay

## 2014-06-07 NOTE — Telephone Encounter (Signed)
  Follow up Call-  Call back number 06/04/2014  Post procedure Call Back phone  # [phone]  Permission to leave phone message Yes     Patient questions:  Do you have a fever, pain , or abdominal swelling? No. Pain Score  0 *  Have you tolerated food without any problems? Yes.    Have you been able to return to your normal activities? Yes.    Do you have any questions about your discharge instructions: Diet   No. Medications  No. Follow up visit  No.  Do you have questions or concerns about your Care? No.  Actions: * If pain score is 4 or above: No action needed, pain <4.

## 2014-06-08 ENCOUNTER — Encounter: Payer: Self-pay | Admitting: Internal Medicine

## 2015-07-11 ENCOUNTER — Ambulatory Visit (INDEPENDENT_AMBULATORY_CARE_PROVIDER_SITE_OTHER): Payer: Medicare Other | Admitting: Family Medicine

## 2015-07-11 ENCOUNTER — Encounter: Payer: Self-pay | Admitting: Family Medicine

## 2015-07-11 VITALS — BP 130/78 | HR 51 | Temp 98.3°F | Ht 71.5 in | Wt 175.0 lb

## 2015-07-11 DIAGNOSIS — J019 Acute sinusitis, unspecified: Secondary | ICD-10-CM | POA: Diagnosis not present

## 2015-07-11 MED ORDER — AMOXICILLIN-POT CLAVULANATE 875-125 MG PO TABS: 1.0000 | ORAL_TABLET | Freq: Two times a day (BID) | ORAL | Status: DC

## 2015-07-11 MED ORDER — HYDROCODONE-HOMATROPINE 5-1.5 MG/5ML PO SYRP: 5.0000 mL | ORAL_SOLUTION | ORAL | Status: DC | PRN

## 2015-07-11 NOTE — Progress Notes (Signed)
   Subjective:    Patient ID: John Trujillo, male    DOB: Oct 10, 1945, 70 y.o.   MRN: [id number]  HPI Here for 3 weeks of sinus pressure, PND, and a dry cough. No fever.    Review of Systems  Constitutional: Negative.   HENT: Positive for congestion and postnasal drip.   Eyes: Negative.   Respiratory: Positive for cough.        Objective:   Physical Exam  Constitutional: He appears well-developed and well-nourished.  HENT:  Right Ear: External ear normal.  Left Ear: External ear normal.  Nose: Nose normal.  Mouth/Throat: Oropharynx is clear and moist.  Eyes: Conjunctivae are normal.  Neck: No thyromegaly present.  Pulmonary/Chest: Effort normal and breath sounds normal.  Lymphadenopathy:    He has no cervical adenopathy.          Assessment & Plan:  Sinusitis, treat with Augmentin

## 2015-07-27 ENCOUNTER — Other Ambulatory Visit (INDEPENDENT_AMBULATORY_CARE_PROVIDER_SITE_OTHER): Payer: Medicare Other

## 2015-07-27 DIAGNOSIS — Z0000 Encounter for general adult medical examination without abnormal findings: Secondary | ICD-10-CM

## 2015-07-27 LAB — POC URINALSYSI DIPSTICK (AUTOMATED)
BILIRUBIN UA: NEGATIVE
Blood, UA: NEGATIVE
GLUCOSE UA: NEGATIVE
Ketones, UA: NEGATIVE
Leukocytes, UA: NEGATIVE
NITRITE UA: NEGATIVE
Protein, UA: NEGATIVE
Spec Grav, UA: 1.02
UROBILINOGEN UA: 0.2
pH, UA: 5.5

## 2015-07-27 LAB — CBC WITH DIFFERENTIAL/PLATELET
BASOS PCT: 1.4 % (ref 0.0–3.0)
Basophils Absolute: 0.1 10*3/uL (ref 0.0–0.1)
EOS PCT: 3.1 % (ref 0.0–5.0)
Eosinophils Absolute: 0.1 10*3/uL (ref 0.0–0.7)
HCT: 42.8 % (ref 39.0–52.0)
HEMOGLOBIN: 14.5 g/dL (ref 13.0–17.0)
LYMPHS ABS: 1.3 10*3/uL (ref 0.7–4.0)
Lymphocytes Relative: 29.1 % (ref 12.0–46.0)
MCHC: 33.9 g/dL (ref 30.0–36.0)
MCV: 92.4 fl (ref 78.0–100.0)
MONO ABS: 0.4 10*3/uL (ref 0.1–1.0)
Monocytes Relative: 9.4 % (ref 3.0–12.0)
Neutro Abs: 2.5 10*3/uL (ref 1.4–7.7)
Neutrophils Relative %: 57 % (ref 43.0–77.0)
Platelets: 219 10*3/uL (ref 150.0–400.0)
RBC: 4.63 Mil/uL (ref 4.22–5.81)
RDW: 13.8 % (ref 11.5–15.5)
WBC: 4.4 10*3/uL (ref 4.0–10.5)

## 2015-07-27 LAB — LIPID PANEL
CHOLESTEROL: 196 mg/dL (ref 0–200)
HDL: 65 mg/dL (ref >39.00)
LDL CALC: 116 mg/dL — AB (ref 0–99)
NonHDL: 130.7
TRIGLYCERIDES: 72 mg/dL (ref 0.0–149.0)
Total CHOL/HDL Ratio: 3
VLDL: 14.4 mg/dL (ref 0.0–40.0)

## 2015-07-27 LAB — BASIC METABOLIC PANEL
BUN: 16 mg/dL (ref 6–23)
CHLORIDE: 102 meq/L (ref 96–112)
CO2: 30 mEq/L (ref 19–32)
Calcium: 9.3 mg/dL (ref 8.4–10.5)
Creatinine, Ser: 0.97 mg/dL (ref 0.40–1.50)
GFR: 81.68 mL/min (ref >60.00)
GLUCOSE: 98 mg/dL (ref 70–99)
POTASSIUM: 4.9 meq/L (ref 3.5–5.1)
Sodium: 137 mEq/L (ref 135–145)

## 2015-07-27 LAB — HEPATIC FUNCTION PANEL
ALBUMIN: 4.1 g/dL (ref 3.5–5.2)
ALT: 26 U/L (ref 0–53)
AST: 19 U/L (ref 0–37)
Alkaline Phosphatase: 56 U/L (ref 39–117)
BILIRUBIN TOTAL: 0.6 mg/dL (ref 0.2–1.2)
Bilirubin, Direct: 0.1 mg/dL (ref 0.0–0.3)
Total Protein: 6.4 g/dL (ref 6.0–8.3)

## 2015-07-27 LAB — TSH: TSH: 1.81 u[IU]/mL (ref 0.35–4.50)

## 2015-07-27 LAB — PSA: PSA: 1.59 ng/mL (ref 0.10–4.00)

## 2015-08-03 ENCOUNTER — Encounter: Payer: Self-pay | Admitting: Family Medicine

## 2015-08-03 ENCOUNTER — Ambulatory Visit (INDEPENDENT_AMBULATORY_CARE_PROVIDER_SITE_OTHER): Payer: Medicare Other | Admitting: Family Medicine

## 2015-08-03 VITALS — BP 138/80 | HR 57 | Temp 98.3°F | Ht 71.5 in | Wt 173.0 lb

## 2015-08-03 DIAGNOSIS — Z209 Contact with and (suspected) exposure to unspecified communicable disease: Secondary | ICD-10-CM

## 2015-08-03 DIAGNOSIS — Z0000 Encounter for general adult medical examination without abnormal findings: Secondary | ICD-10-CM | POA: Diagnosis not present

## 2015-08-03 DIAGNOSIS — Z23 Encounter for immunization: Secondary | ICD-10-CM

## 2015-08-03 NOTE — Addendum Note (Signed)
Addended by: Aggie Hacker A on: 08/03/2015 02:29 PM   Modules accepted: Orders

## 2015-08-03 NOTE — Progress Notes (Signed)
   Subjective:    Patient ID: John Trujillo, male    DOB: 09-04-1945, 70 y.o.   MRN: [id number]  HPI 70 yr old male for a well exam. He feels fine and has no concerns. He got over his recent sinusitis.    Review of Systems  Constitutional: Negative.   HENT: Negative.   Eyes: Negative.   Respiratory: Negative.   Cardiovascular: Negative.   Gastrointestinal: Negative.   Genitourinary: Negative.   Musculoskeletal: Negative.   Skin: Negative.   Neurological: Negative.   Psychiatric/Behavioral: Negative.        Objective:   Physical Exam  Constitutional: He is oriented to person, place, and time. He appears well-developed and well-nourished. No distress.  HENT:  Head: Normocephalic and atraumatic.  Right Ear: External ear normal.  Left Ear: External ear normal.  Nose: Nose normal.  Mouth/Throat: Oropharynx is clear and moist. No oropharyngeal exudate.  Eyes: Conjunctivae and EOM are normal. Pupils are equal, round, and reactive to light. Right eye exhibits no discharge. Left eye exhibits no discharge. No scleral icterus.  Neck: Neck supple. No JVD present. No tracheal deviation present. No thyromegaly present.  Cardiovascular: Normal rate, regular rhythm, normal heart sounds and intact distal pulses.  Exam reveals no gallop and no friction rub.   No murmur heard. EKG normal   Pulmonary/Chest: Effort normal and breath sounds normal. No respiratory distress. He has no wheezes. He has no rales. He exhibits no tenderness.  Abdominal: Soft. Bowel sounds are normal. He exhibits no distension and no mass. There is no tenderness. There is no rebound and no guarding.  Genitourinary: Rectum normal, prostate normal and penis normal. Guaiac negative stool. No penile tenderness.  Musculoskeletal: Normal range of motion. He exhibits no edema or tenderness.  Lymphadenopathy:    He has no cervical adenopathy.  Neurological: He is alert and oriented to person, place, and time. He has normal  reflexes. No cranial nerve deficit. He exhibits normal muscle tone. Coordination normal.  Skin: Skin is warm and dry. No rash noted. He is not diaphoretic. No erythema. No pallor.  Psychiatric: He has a normal mood and affect. His behavior is normal. Judgment and thought content normal.          Assessment & Plan:  Well exam. We discussed diet and exercise.  Laurey Morale, MD

## 2015-08-03 NOTE — Progress Notes (Signed)
Pre visit review using our clinic review tool, if applicable. No additional management support is needed unless otherwise documented below in the visit note. 

## 2015-08-04 LAB — HEPATITIS C ANTIBODY: HCV Ab: NEGATIVE

## 2016-02-23 DIAGNOSIS — Z23 Encounter for immunization: Secondary | ICD-10-CM | POA: Diagnosis not present

## 2016-07-10 ENCOUNTER — Ambulatory Visit (INDEPENDENT_AMBULATORY_CARE_PROVIDER_SITE_OTHER): Payer: Medicare Other | Admitting: Family Medicine

## 2016-07-10 VITALS — BP 170/96 | HR 52 | Temp 98.2°F | Ht 71.5 in | Wt 180.0 lb

## 2016-07-10 DIAGNOSIS — J018 Other acute sinusitis: Secondary | ICD-10-CM | POA: Diagnosis not present

## 2016-07-10 MED ORDER — LEVOFLOXACIN 500 MG PO TABS: 500.0000 mg | ORAL_TABLET | Freq: Every day | ORAL | 0 refills | Status: AC

## 2016-07-10 MED ORDER — HYDROCODONE-HOMATROPINE 5-1.5 MG/5ML PO SYRP: 5.0000 mL | ORAL_SOLUTION | ORAL | 0 refills | Status: DC | PRN

## 2016-07-10 NOTE — Progress Notes (Signed)
Pre visit review using our clinic review tool, if applicable. No additional management support is needed unless otherwise documented below in the visit note. 

## 2016-07-10 NOTE — Patient Instructions (Signed)
WE NOW OFFER   [facility] FAST TRACK!!!  SAME DAY Appointments for ACUTE CARE  Such as: Sprains, Injuries, cuts, abrasions, rashes, muscle pain, joint pain, back pain Colds, flu, sore throats, headache, allergies, cough, fever  Ear pain, sinus and eye infections Abdominal pain, nausea, vomiting, diarrhea, upset stomach Animal/insect bites  3 Easy Ways to Schedule: Walk-In Scheduling Call in scheduling Mychart Sign-up: [email or web address]         

## 2016-07-11 ENCOUNTER — Encounter: Payer: Self-pay | Admitting: Family Medicine

## 2016-07-11 NOTE — Progress Notes (Signed)
   Subjective:    Patient ID: John Trujillo, male    DOB: 16-Mar-1946, 71 y.o.   MRN: [id number]  HPI Here for 3 weeks of stuffy head, PND, and coughing up green sputum. No fever.   Review of Systems  Constitutional: Negative.   HENT: Positive for congestion, postnasal drip, sinus pain and sinus pressure. Negative for ear pain and sore throat.   Eyes: Negative.   Respiratory: Positive for cough.        Objective:   Physical Exam  Constitutional: He appears well-developed and well-nourished.  HENT:  Right Ear: External ear normal.  Left Ear: External ear normal.  Nose: Nose normal.  Mouth/Throat: Oropharynx is clear and moist.  Eyes: Conjunctivae are normal.  Neck: No thyromegaly present.  Pulmonary/Chest: Effort normal and breath sounds normal. No respiratory distress. He has no wheezes. He has no rales.  Lymphadenopathy:    He has no cervical adenopathy.          Assessment & Plan:  Sinusitis, treat with Levaquin. Use Mucinex prn.  Alysia Penna, MD

## 2016-12-24 DIAGNOSIS — M25551 Pain in right hip: Secondary | ICD-10-CM | POA: Diagnosis not present

## 2016-12-24 DIAGNOSIS — M545 Low back pain: Secondary | ICD-10-CM | POA: Diagnosis not present

## 2016-12-24 DIAGNOSIS — M25552 Pain in left hip: Secondary | ICD-10-CM | POA: Diagnosis not present

## 2016-12-24 MED FILL — predniSONE 5 MG (21) TBPK: 5 | 12 days supply | Qty: 21 | Fill #0

## 2017-01-02 DIAGNOSIS — G8929 Other chronic pain: Secondary | ICD-10-CM | POA: Diagnosis not present

## 2017-01-02 DIAGNOSIS — M545 Low back pain: Secondary | ICD-10-CM | POA: Diagnosis not present

## 2017-01-04 DIAGNOSIS — M545 Low back pain: Secondary | ICD-10-CM | POA: Diagnosis not present

## 2017-01-04 DIAGNOSIS — G8929 Other chronic pain: Secondary | ICD-10-CM | POA: Diagnosis not present

## 2017-01-07 DIAGNOSIS — G8929 Other chronic pain: Secondary | ICD-10-CM | POA: Diagnosis not present

## 2017-01-07 DIAGNOSIS — M545 Low back pain: Secondary | ICD-10-CM | POA: Diagnosis not present

## 2017-01-09 DIAGNOSIS — M545 Low back pain: Secondary | ICD-10-CM | POA: Diagnosis not present

## 2017-01-09 DIAGNOSIS — G8929 Other chronic pain: Secondary | ICD-10-CM | POA: Diagnosis not present

## 2017-01-14 DIAGNOSIS — M545 Low back pain: Secondary | ICD-10-CM | POA: Diagnosis not present

## 2017-01-14 DIAGNOSIS — G8929 Other chronic pain: Secondary | ICD-10-CM | POA: Diagnosis not present

## 2017-01-16 DIAGNOSIS — G8929 Other chronic pain: Secondary | ICD-10-CM | POA: Diagnosis not present

## 2017-01-16 DIAGNOSIS — M545 Low back pain: Secondary | ICD-10-CM | POA: Diagnosis not present

## 2017-01-21 DIAGNOSIS — M545 Low back pain: Secondary | ICD-10-CM | POA: Diagnosis not present

## 2017-01-21 DIAGNOSIS — G8929 Other chronic pain: Secondary | ICD-10-CM | POA: Diagnosis not present

## 2017-01-28 DIAGNOSIS — M545 Low back pain: Secondary | ICD-10-CM | POA: Diagnosis not present

## 2017-01-28 DIAGNOSIS — G8929 Other chronic pain: Secondary | ICD-10-CM | POA: Diagnosis not present

## 2017-02-01 DIAGNOSIS — M545 Low back pain: Secondary | ICD-10-CM | POA: Diagnosis not present

## 2017-02-01 DIAGNOSIS — G8929 Other chronic pain: Secondary | ICD-10-CM | POA: Diagnosis not present

## 2017-02-08 DIAGNOSIS — Z23 Encounter for immunization: Secondary | ICD-10-CM | POA: Diagnosis not present

## 2017-02-13 DIAGNOSIS — M25552 Pain in left hip: Secondary | ICD-10-CM | POA: Diagnosis not present

## 2017-02-13 DIAGNOSIS — M25551 Pain in right hip: Secondary | ICD-10-CM | POA: Diagnosis not present

## 2017-02-13 DIAGNOSIS — M160 Bilateral primary osteoarthritis of hip: Secondary | ICD-10-CM | POA: Diagnosis not present

## 2017-03-05 ENCOUNTER — Ambulatory Visit: Payer: Medicare Other | Admitting: Family Medicine

## 2017-03-05 ENCOUNTER — Encounter: Payer: Self-pay | Admitting: Family Medicine

## 2017-03-05 ENCOUNTER — Encounter (HOSPITAL_COMMUNITY): Payer: Self-pay | Admitting: *Deleted

## 2017-03-05 VITALS — BP 128/80 | Temp 98.2°F | Ht 71.5 in | Wt 180.0 lb

## 2017-03-05 DIAGNOSIS — N529 Male erectile dysfunction, unspecified: Secondary | ICD-10-CM | POA: Diagnosis not present

## 2017-03-05 DIAGNOSIS — M25551 Pain in right hip: Secondary | ICD-10-CM | POA: Diagnosis not present

## 2017-03-05 DIAGNOSIS — M150 Primary generalized (osteo)arthritis: Secondary | ICD-10-CM | POA: Diagnosis not present

## 2017-03-05 DIAGNOSIS — N401 Enlarged prostate with lower urinary tract symptoms: Secondary | ICD-10-CM

## 2017-03-05 DIAGNOSIS — N486 Induration penis plastica: Secondary | ICD-10-CM | POA: Diagnosis not present

## 2017-03-05 DIAGNOSIS — E785 Hyperlipidemia, unspecified: Secondary | ICD-10-CM | POA: Diagnosis not present

## 2017-03-05 DIAGNOSIS — M159 Polyosteoarthritis, unspecified: Secondary | ICD-10-CM

## 2017-03-05 DIAGNOSIS — M8949 Other hypertrophic osteoarthropathy, multiple sites: Secondary | ICD-10-CM

## 2017-03-05 DIAGNOSIS — N138 Other obstructive and reflux uropathy: Secondary | ICD-10-CM | POA: Diagnosis not present

## 2017-03-05 DIAGNOSIS — M1990 Unspecified osteoarthritis, unspecified site: Secondary | ICD-10-CM | POA: Insufficient documentation

## 2017-03-05 MED ORDER — SILDENAFIL CITRATE 100 MG PO TABS: 100.0000 mg | ORAL_TABLET | Freq: Every day | ORAL | 11 refills | Status: DC | PRN

## 2017-03-05 NOTE — Progress Notes (Signed)
   Subjective:    Patient ID: John Trujillo, male    DOB: [birth year], 71 y.o.   MRN: [id number]  HPI Here for several issues. First he is scheduled to undergo a right hip total arthroplasty per Dr. Adriana Mccallum on 03-26-17. He has had steadily worsening pain in this hip for years. He has ED and has tried some Sildenafil from [facility] with poor results. He wants to go back to Viagra. He has noticed some curvature to the penis over the past year but he can still have sex easily and there is no pain. In general he feels well oter than joint pains.    Review of Systems  Constitutional: Negative.   HENT: Negative.   Eyes: Negative.   Respiratory: Negative.   Cardiovascular: Negative.   Gastrointestinal: Negative.   Genitourinary: Negative.   Musculoskeletal: Positive for arthralgias.  Skin: Negative.   Neurological: Negative.   Psychiatric/Behavioral: Negative.        Objective:   Physical Exam  Constitutional: He is oriented to person, place, and time. He appears well-developed and well-nourished. No distress.  HENT:  Head: Normocephalic and atraumatic.  Right Ear: External ear normal.  Left Ear: External ear normal.  Nose: Nose normal.  Mouth/Throat: Oropharynx is clear and moist. No oropharyngeal exudate.  Eyes: Conjunctivae and EOM are normal. Pupils are equal, round, and reactive to light. Right eye exhibits no discharge. Left eye exhibits no discharge. No scleral icterus.  Neck: Neck supple. No JVD present. No tracheal deviation present. No thyromegaly present.  Cardiovascular: Normal rate, regular rhythm, normal heart sounds and intact distal pulses. Exam reveals no gallop and no friction rub.  No murmur heard. EKG is normal with NSR   Pulmonary/Chest: Effort normal and breath sounds normal. No respiratory distress. He has no wheezes. He has no rales. He exhibits no tenderness.  Abdominal: Soft. Bowel sounds are normal. He exhibits no distension and no mass. There  is no tenderness. There is no rebound and no guarding.  Genitourinary: Rectum normal, prostate normal and penis normal. Rectal exam shows guaiac negative stool. No penile tenderness.  Musculoskeletal: Normal range of motion. He exhibits no edema or tenderness.  Lymphadenopathy:    He has no cervical adenopathy.  Neurological: He is alert and oriented to person, place, and time. He has normal reflexes. No cranial nerve deficit. He exhibits normal muscle tone. Coordination normal.  Skin: Skin is warm and dry. No rash noted. He is not diaphoretic. No erythema. No pallor.  Psychiatric: He has a normal mood and affect. His behavior is normal. Judgment and thought content normal.          Assessment & Plan:  He is healthy in general and he is cleared for hip replacement surgery. He will get fasting labs sometime soon. His BPH and ED are stable. He has mild Peyronies disease and no treatment is needed.  Alysia Penna, MD

## 2017-03-05 NOTE — Patient Instructions (Signed)
WE NOW OFFER   [facility] FAST TRACK!!!  SAME DAY Appointments for ACUTE CARE  Such as: Sprains, Injuries, cuts, abrasions, rashes, muscle pain, joint pain, back pain Colds, flu, sore throats, headache, allergies, cough, fever  Ear pain, sinus and eye infections Abdominal pain, nausea, vomiting, diarrhea, upset stomach Animal/insect bites  3 Easy Ways to Schedule: Walk-In Scheduling Call in scheduling Mychart Sign-up: [email or web address]         

## 2017-03-06 ENCOUNTER — Other Ambulatory Visit (INDEPENDENT_AMBULATORY_CARE_PROVIDER_SITE_OTHER): Payer: Medicare Other

## 2017-03-06 DIAGNOSIS — N401 Enlarged prostate with lower urinary tract symptoms: Secondary | ICD-10-CM

## 2017-03-06 DIAGNOSIS — E785 Hyperlipidemia, unspecified: Secondary | ICD-10-CM | POA: Diagnosis not present

## 2017-03-06 DIAGNOSIS — N138 Other obstructive and reflux uropathy: Secondary | ICD-10-CM | POA: Diagnosis not present

## 2017-03-06 LAB — BASIC METABOLIC PANEL
BUN: 13 mg/dL (ref 6–23)
CHLORIDE: 102 meq/L (ref 96–112)
CO2: 30 mEq/L (ref 19–32)
CREATININE: 0.98 mg/dL (ref 0.40–1.50)
Calcium: 9.7 mg/dL (ref 8.4–10.5)
GFR: 80.34 mL/min (ref >60.00)
GLUCOSE: 92 mg/dL (ref 70–99)
POTASSIUM: 4.6 meq/L (ref 3.5–5.1)
Sodium: 138 mEq/L (ref 135–145)

## 2017-03-06 LAB — CBC WITH DIFFERENTIAL/PLATELET
BASOS PCT: 1.9 % (ref 0.0–3.0)
Basophils Absolute: 0.1 10*3/uL (ref 0.0–0.1)
EOS PCT: 3.8 % (ref 0.0–5.0)
Eosinophils Absolute: 0.2 10*3/uL (ref 0.0–0.7)
HEMATOCRIT: 44.4 % (ref 39.0–52.0)
HEMOGLOBIN: 15.2 g/dL (ref 13.0–17.0)
LYMPHS PCT: 32.3 % (ref 12.0–46.0)
Lymphs Abs: 1.3 10*3/uL (ref 0.7–4.0)
MCHC: 34.2 g/dL (ref 30.0–36.0)
MCV: 94.4 fl (ref 78.0–100.0)
MONO ABS: 0.4 10*3/uL (ref 0.1–1.0)
MONOS PCT: 10.7 % (ref 3.0–12.0)
Neutro Abs: 2 10*3/uL (ref 1.4–7.7)
Neutrophils Relative %: 51.3 % (ref 43.0–77.0)
Platelets: 258 10*3/uL (ref 150.0–400.0)
RBC: 4.71 Mil/uL (ref 4.22–5.81)
RDW: 13.5 % (ref 11.5–15.5)
WBC: 4 10*3/uL (ref 4.0–10.5)

## 2017-03-06 LAB — PSA: PSA: 2.92 ng/mL (ref 0.10–4.00)

## 2017-03-06 LAB — LIPID PANEL
CHOL/HDL RATIO: 3
Cholesterol: 204 mg/dL — ABNORMAL HIGH (ref 0–200)
HDL: 62.8 mg/dL (ref >39.00)
LDL CALC: 119 mg/dL — AB (ref 0–99)
NONHDL: 141.46
Triglycerides: 110 mg/dL (ref 0.0–149.0)
VLDL: 22 mg/dL (ref 0.0–40.0)

## 2017-03-06 LAB — HEPATIC FUNCTION PANEL
ALK PHOS: 55 U/L (ref 39–117)
ALT: 21 U/L (ref 0–53)
AST: 18 U/L (ref 0–37)
Albumin: 4.2 g/dL (ref 3.5–5.2)
BILIRUBIN DIRECT: 0.2 mg/dL (ref 0.0–0.3)
TOTAL PROTEIN: 6.8 g/dL (ref 6.0–8.3)
Total Bilirubin: 0.9 mg/dL (ref 0.2–1.2)

## 2017-03-06 LAB — POC URINALSYSI DIPSTICK (AUTOMATED)
Bilirubin, UA: NEGATIVE
Blood, UA: NEGATIVE
GLUCOSE UA: NEGATIVE
KETONES UA: NEGATIVE
LEUKOCYTES UA: NEGATIVE
Nitrite, UA: NEGATIVE
PROTEIN UA: NEGATIVE
SPEC GRAV UA: 1.015 (ref 1.010–1.025)
Urobilinogen, UA: 0.2 E.U./dL
pH, UA: 5.5 (ref 5.0–8.0)

## 2017-03-06 LAB — TSH: TSH: 2.9 u[IU]/mL (ref 0.35–4.50)

## 2017-03-20 ENCOUNTER — Encounter (HOSPITAL_COMMUNITY): Payer: Self-pay

## 2017-03-20 ENCOUNTER — Encounter (HOSPITAL_COMMUNITY)
Admission: RE | Admit: 2017-03-20 | Discharge: 2017-03-20 | Disposition: A | Discharge status: Home / Self Care | Payer: Medicare Other | Source: Ambulatory Visit | Attending: Orthopedic Surgery | Admitting: Orthopedic Surgery

## 2017-03-20 ENCOUNTER — Other Ambulatory Visit: Payer: Self-pay

## 2017-03-20 DIAGNOSIS — M1611 Unilateral primary osteoarthritis, right hip: Secondary | ICD-10-CM | POA: Insufficient documentation

## 2017-03-20 DIAGNOSIS — Z01812 Encounter for preprocedural laboratory examination: Secondary | ICD-10-CM | POA: Insufficient documentation

## 2017-03-20 LAB — ABO/RH: ABO/RH(D): A NEG

## 2017-03-20 LAB — SURGICAL PCR SCREEN
MRSA, PCR: NEGATIVE
Staphylococcus aureus: NEGATIVE

## 2017-03-20 NOTE — Progress Notes (Addendum)
EKG-03/05/17- -epic 03/06/17-labs- CBc,DIFF,CMp-epic  Clearance in 03/05/17 office visit note of Dr Alysia Penna. - in epic

## 2017-03-20 NOTE — H&P (Signed)
TOTAL HIP ADMISSION H&P  Patient is admitted for right total hip arthroplasty, anterior approach.  Subjective:  Chief Complaint:    Right hip primary OA / pain  HPI: John Trujillo, 71 y.o. male, has a history of pain and functional disability in the right hip(s) due to arthritis and patient has failed non-surgical conservative treatments for greater than 12 weeks to include NSAID's and/or analgesics and activity modification.  Onset of symptoms was gradual starting >10 years ago with gradually worsening course since that time.The patient noted no past surgery on the right hip(s).  Patient currently rates pain in the right hip at 8 out of 10 with activity. Patient has night pain, worsening of pain with activity and weight bearing, trendelenberg gait, pain that interfers with activities of daily living and pain with passive range of motion. Patient has evidence of periarticular osteophytes and joint space narrowing by imaging studies. This condition presents safety issues increasing the risk of falls.   There is no current active infection.  Risks, benefits and expectations were discussed with the patient.  Risks including but not limited to the risk of anesthesia, blood clots, nerve damage, blood vessel damage, failure of the prosthesis, infection and up to and including death.  Patient understand the risks, benefits and expectations and wishes to proceed with surgery.   PCP: Laurey Morale, MD  D/C Plans:       Home   Post-op Meds:       No Rx given  Tranexamic Acid:      To be given - IV   Decadron:      Is to be given  FYI:     ASA  Norco  DME:   Rx given for - RW   PT:    No PT    Patient Active Problem List   Diagnosis Date Noted  . Dyslipidemia 03/05/2017  . Erectile dysfunction 03/05/2017  . 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 Osteoarthritis 03/05/2017  . Right hip pain 03/05/2017  . HEMORRHOIDS, INTERNAL 09/06/2007  . DIVERTICULOSIS, COLON 09/06/2007  . 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 PROSTATITIS, HX OF 09/06/2007  . POLYPECTOMY, HX OF 09/06/2007   Past Medical History:  Diagnosis Date  . Arthritis    in knees, has seen Dr. Wynelle Link   . 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 History of hemorrhoids   . Prostatitis   . Pyelonephritis    history of urinary retention  . Tubulovillous adenoma of colon     Past Surgical History:  Procedure Laterality Date  . COLONOSCOPY  06-04-14   per Dr. Olevia Perches, adenomatous  polyps, repeat in 5 yrs   . history of Bezoar removed from back pre-malignant    . LAPAROTOMY     for intestinal obstruction as a child  . mini laparotomy for variceal repair    . POLYPECTOMY    . TONSILLECTOMY      No current facility-administered medications for this encounter.    Current Outpatient Medications  Medication Sig Dispense Refill Last Dose  . aspirin EC 81 MG tablet Take 81 mg daily by mouth.     Marland Kitchen GLUCOSAMINE-CHONDROITIN PO Take 1 capsule daily by mouth.     . naproxen sodium (ALEVE) 220 MG tablet Take 220 mg daily as needed by mouth (pain).     . Omega-3 Fatty Acids (FISH OIL PO) Take 1 capsule daily by mouth.    Taking  .  sildenafil (VIAGRA) 100 MG tablet Take 1 tablet (100 mg total) daily as needed by mouth for erectile dysfunction. (Patient taking differently: Take 50 mg daily as needed by mouth for erectile dysfunction. ) 10 tablet 11    No Known Allergies  Social History   Tobacco Use  . Smoking status: Never Smoker  . Smokeless tobacco: Never Used  Substance Use Topics  . Alcohol use: Yes    Alcohol/week: 8.4 oz    Types: 14 Standard drinks or equivalent per week    Comment: 2 glasses of wine each night    Family History  Problem Relation Age of Onset  . Cancer Father        Prostate cancer  . Colon cancer Neg Hx   . Rectal cancer Neg Hx   . Stomach cancer Neg Hx      Review of Systems  Constitutional: Negative.   HENT: Negative.   Eyes:  Negative.   Respiratory: Negative.   Cardiovascular: Negative.   Gastrointestinal: Negative.   Genitourinary: Negative.   Musculoskeletal: Positive for joint pain.  Skin: Negative.   Neurological: Negative.   Endo/Heme/Allergies: Negative.   Psychiatric/Behavioral: Negative.     Objective:  Physical Exam  Constitutional: He is oriented to person, place, and time. He appears well-developed.  HENT:  Head: Normocephalic.  Eyes: Pupils are equal, round, and reactive to light.  Neck: Neck supple. No JVD present. No tracheal deviation present. No thyromegaly present.  Cardiovascular: Normal rate, regular rhythm and intact distal pulses.  Respiratory: Effort normal and breath sounds normal. No respiratory distress. He has no wheezes.  GI: Soft. There is no tenderness. There is no guarding.  Musculoskeletal:       Right hip: He exhibits decreased range of motion, decreased strength, tenderness and bony tenderness. He exhibits no swelling, no deformity and no laceration.  Lymphadenopathy:    He has no cervical adenopathy.  Neurological: He is alert and oriented to person, place, and time.  Skin: Skin is warm and dry.  Psychiatric: He has a normal mood and affect.    Vital signs in last 24 hours: Temp:  [97.9 F (36.6 C)] 97.9 F (36.6 C) (11/21 1000) Pulse Rate:  [65] 65 (11/21 1000) Resp:  [16] 16 (11/21 1000) BP: (147)/(83) 147/83 (11/21 1000) SpO2:  [100 %] 100 % (11/21 1000) Weight:  [79.8 kg (176 lb)] 79.8 kg (176 lb) (11/21 1000)  Labs:   Estimated body mass index is 24.55 kg/m as calculated from the following:   Height as of 03/20/17: 5\' 11"  (1.803 m).   Weight as of 03/20/17: 79.8 kg (176 lb).   Imaging Review Plain radiographs demonstrate severe degenerative joint disease of the right hip(s). The bone quality appears to be good for age and reported activity level.  Assessment/Plan:  End stage arthritis, right hip(s)  The patient history, physical examination,  clinical judgement of the provider and imaging studies are consistent with end stage degenerative joint disease of the right hip(s) and total hip arthroplasty is deemed medically necessary. The treatment options including medical management, injection therapy, arthroscopy and arthroplasty were discussed at length. The risks and benefits of total hip arthroplasty were presented and reviewed. The risks due to aseptic loosening, infection, stiffness, dislocation/subluxation,  thromboembolic complications and other imponderables were discussed.  The patient acknowledged the explanation, agreed to proceed with the plan and consent was signed. Patient is being admitted for inpatient treatment for surgery, pain control, PT, OT, prophylactic antibiotics, VTE prophylaxis, progressive ambulation  and ADL's and discharge planning.The patient is planning to be discharged home.     [place] P[place] Evania Lyne   PA-C  03/20/2017, 10:05 PM

## 2017-03-20 NOTE — Patient Instructions (Addendum)
John Trujillo  03/20/2017   Your procedure is scheduled on: 03/26/2017   Report to [facility] Main  Entrance   Report to admitting at    12noon   Call this number if you have problems the morning of surgery  [phone]   Remember: ONLY 1 PERSON MAY GO WITH YOU TO SHORT STAY TO GET  READY MORNING OF YOUR SURGERY.  Do not eat food or :After Midnight. Clear liquids from 12 midnite until 0800am then nothing by mouth.      Take these medicines the morning of surgery with A SIP OF WATER: none                                 You may not have any metal on your body including hair pins and              piercings  Do not wear jewelry,  lotions, powders or perfumes, deodorant                         Men may shave face and neck.   Do not bring valuables to the hospital. [place].  Contacts, dentures or bridgework may not be worn into surgery.  Leave suitcase in the car. After surgery it may be brought to your room.        CLEAR LIQUID DIET   Foods Allowed                                                                     Foods Excluded  Coffee and tea, regular and decaf                             liquids that you cannot  Plain Jell-O in any flavor                                             see through such as: Fruit ices (not with fruit pulp)                                     milk, soups, orange juice  Iced Popsicles                                    All solid food Carbonated beverages, regular and diet                                    Cranberry, grape and apple juices Sports drinks like Gatorade Lightly seasoned clear broth or consume(fat free) Sugar, honey syrup  Sample Menu Breakfast                                Lunch                                     Supper Cranberry juice                    Beef broth                            Chicken broth Jell-O                                      Grape juice                           Apple juice Coffee or tea                        Jell-O                                      Popsicle                                                Coffee or tea                        Coffee or tea  _____________________________________________________________________                Please read over the following fact sheets you were given: _____________________________________________________________________             [facility] - Preparing for Surgery Before surgery, you can play an important role.  Because skin is not sterile, your skin needs to be as free of germs as possible.  You can reduce the number of germs on your skin by washing with CHG (chlorahexidine gluconate) soap before surgery.  CHG is an antiseptic cleaner which kills germs and bonds with the skin to continue killing germs even after washing. Please DO NOT use if you have an allergy to CHG or antibacterial soaps.  If your skin becomes reddened/irritated stop using the CHG and inform your nurse when you arrive at Short Stay. Do not shave (including legs and underarms) for at least 48 hours prior to the first CHG shower.  You may shave your face/neck. Please follow these instructions carefully:  1.  Shower with CHG Soap the night before surgery and the  morning of Surgery.  2.  If you choose to wash your hair, wash your hair first as usual with your  normal  shampoo.  3.  After you shampoo, rinse your hair and body thoroughly to remove the  shampoo.                           4.  Use CHG as you would any other liquid  soap.  You can apply chg directly  to the skin and wash                       Gently with a scrungie or clean washcloth.  5.  Apply the CHG Soap to your body ONLY FROM THE NECK DOWN.   Do not use on face/ open                           Wound or open sores. Avoid contact with eyes, ears mouth and genitals (private parts).                       Wash face,  Genitals  (private parts) with your normal soap.             6.  Wash thoroughly, paying special attention to the area where your surgery  will be performed.  7.  Thoroughly rinse your body with warm water from the neck down.  8.  DO NOT shower/wash with your normal soap after using and rinsing off  the CHG Soap.                9.  Pat yourself dry with a clean towel.            10.  Wear clean pajamas.            11.  Place clean sheets on your bed the night of your first shower and do not  sleep with pets. Day of Surgery : Do not apply any lotions/deodorants the morning of surgery.  Please wear clean clothes to the hospital/surgery center.  FAILURE TO FOLLOW THESE INSTRUCTIONS MAY RESULT IN THE CANCELLATION OF YOUR SURGERY PATIENT SIGNATURE_________________________________  NURSE SIGNATURE__________________________________  ________________________________________________________________________  WHAT IS A BLOOD TRANSFUSION? Blood Transfusion Information  A transfusion is the replacement of blood or some of its parts. Blood is made up of multiple cells which provide different functions.  Red blood cells carry oxygen and are used for blood loss replacement.  White blood cells fight against infection.  Platelets control bleeding.  Plasma helps clot blood.  Other blood products are available for specialized needs, such as hemophilia or other clotting disorders. BEFORE THE TRANSFUSION  Who gives blood for transfusions?   Healthy volunteers who are fully evaluated to make sure their blood is safe. This is blood bank blood. Transfusion therapy is the safest it has ever been in the practice of medicine. Before blood is taken from a donor, a complete history is taken to make sure that person has no history of diseases nor engages in risky social behavior (examples are intravenous drug use or sexual activity with multiple partners). The donor's travel history is screened to minimize risk of  transmitting infections, such as malaria. The donated blood is tested for signs of infectious diseases, such as HIV and hepatitis. The blood is then tested to be sure it is compatible with you in order to minimize the chance of a transfusion reaction. If you or a relative donates blood, this is often done in anticipation of surgery and is not appropriate for emergency situations. It takes many days to process the donated blood. RISKS AND COMPLICATIONS Although transfusion therapy is very safe and saves many lives, the main dangers of transfusion include:   Getting an infectious disease.  Developing a transfusion reaction. This is an allergic reaction to something in the blood  you were given. Every precaution is taken to prevent this. The decision to have a blood transfusion has been considered carefully by your caregiver before blood is given. Blood is not given unless the benefits outweigh the risks. AFTER THE TRANSFUSION  Right after receiving a blood transfusion, you will usually feel much better and more energetic. This is especially true if your red blood cells have gotten low (anemic). The transfusion raises the level of the red blood cells which carry oxygen, and this usually causes an energy increase.  The nurse administering the transfusion will monitor you carefully for complications. HOME CARE INSTRUCTIONS  No special instructions are needed after a transfusion. You may find your energy is better. Speak with your caregiver about any limitations on activity for underlying diseases you may have. SEEK MEDICAL CARE IF:   Your condition is not improving after your transfusion.  You develop redness or irritation at the intravenous (IV) site. SEEK IMMEDIATE MEDICAL CARE IF:  Any of the following symptoms occur over the next 12 hours:  Shaking chills.  You have a temperature by mouth above 102 F (38.9 C), not controlled by medicine.  Chest, back, or muscle pain.  People around you  feel you are not acting correctly or are confused.  Shortness of breath or difficulty breathing.  Dizziness and fainting.  You get a rash or develop hives.  You have a decrease in urine output.  Your urine turns a dark color or changes to pink, red, or brown. Any of the following symptoms occur over the next 10 days:  You have a temperature by mouth above 102 F (38.9 C), not controlled by medicine.  Shortness of breath.  Weakness after normal activity.  The white part of the eye turns yellow (jaundice).  You have a decrease in the amount of urine or are urinating less often.  Your urine turns a dark color or changes to pink, red, or brown. Document Released: 04/13/2000 Document Revised: 07/09/2011 Document Reviewed: 12/01/2007 ExitCare Patient Information 2014 [place].  _______________________________________________________________________  Incentive Spirometer  An incentive spirometer is a tool that can help keep your lungs clear and active. This tool measures how well you are filling your lungs with each breath. Taking long deep breaths may help reverse or decrease the chance of developing breathing (pulmonary) problems (especially infection) following:  A long period of time when you are unable to move or be active. BEFORE THE PROCEDURE   If the spirometer includes an indicator to show your best effort, your nurse or respiratory therapist will set it to a desired goal.  If possible, sit up straight or lean slightly forward. Try not to slouch.  Hold the incentive spirometer in an upright position. INSTRUCTIONS FOR USE  1. Sit on the edge of your bed if possible, or sit up as far as you can in bed or on a chair. 2. Hold the incentive spirometer in an upright position. 3. Breathe out normally. 4. Place the mouthpiece in your mouth and seal your lips tightly around it. 5. Breathe in slowly and as deeply as possible, raising the piston or the ball toward the top  of the column. 6. Hold your breath for 3-5 seconds or for as long as possible. Allow the piston or ball to fall to the bottom of the column. 7. Remove the mouthpiece from your mouth and breathe out normally. 8. Rest for a few seconds and repeat Steps 1 through 7 at least 10 times every 1-2 hours  when you are awake. Take your time and take a few normal breaths between deep breaths. 9. The spirometer may include an indicator to show your best effort. Use the indicator as a goal to work toward during each repetition. 10. After each set of 10 deep breaths, practice coughing to be sure your lungs are clear. If you have an incision (the cut made at the time of surgery), support your incision when coughing by placing a pillow or rolled up towels firmly against it. Once you are able to get out of bed, walk around indoors and cough well. You may stop using the incentive spirometer when instructed by your caregiver.  RISKS AND COMPLICATIONS  Take your time so you do not get dizzy or light-headed.  If you are in pain, you may need to take or ask for pain medication before doing incentive spirometry. It is harder to take a deep breath if you are having pain. AFTER USE  Rest and breathe slowly and easily.  It can be helpful to keep track of a log of your progress. Your caregiver can provide you with a simple table to help with this. If you are using the spirometer at home, follow these instructions: [place] IF:   You are having difficultly using the spirometer.  You have trouble using the spirometer as often as instructed.  Your pain medication is not giving enough relief while using the spirometer.  You develop fever of 100.5 F (38.1 C) or higher. SEEK IMMEDIATE MEDICAL CARE IF:   You cough up bloody sputum that had not been present before.  You develop fever of 102 F (38.9 C) or greater.  You develop worsening pain at or near the incision site. MAKE SURE YOU:   Understand these  instructions.  Will watch your condition.  Will get help right away if you are not doing well or get worse. Document Released: 08/27/2006 Document Revised: 07/09/2011 Document Reviewed: 10/28/2006 [facility] Patient Information 2014 [place], [place].   ________________________________________________________________________

## 2017-03-26 ENCOUNTER — Other Ambulatory Visit: Payer: Self-pay

## 2017-03-26 ENCOUNTER — Inpatient Hospital Stay (HOSPITAL_COMMUNITY): Payer: Medicare Other | Admitting: Anesthesiology

## 2017-03-26 ENCOUNTER — Encounter (HOSPITAL_COMMUNITY)
Admission: RE | Disposition: A | Discharge status: Home / Self Care | Payer: Self-pay | Source: Ambulatory Visit | Attending: Orthopedic Surgery

## 2017-03-26 ENCOUNTER — Inpatient Hospital Stay (HOSPITAL_COMMUNITY): Payer: Medicare Other

## 2017-03-26 ENCOUNTER — Inpatient Hospital Stay (HOSPITAL_COMMUNITY)
Admission: RE | Admit: 2017-03-26 | Discharge: 2017-03-27 | DRG: 470 | Disposition: A | Discharge status: Home / Self Care | Payer: Medicare Other | Source: Ambulatory Visit | Attending: Orthopedic Surgery | Admitting: Orthopedic Surgery

## 2017-03-26 ENCOUNTER — Encounter (HOSPITAL_COMMUNITY): Payer: Self-pay | Admitting: *Deleted

## 2017-03-26 DIAGNOSIS — Q245 Malformation of coronary vessels: Secondary | ICD-10-CM | POA: Diagnosis not present

## 2017-03-26 DIAGNOSIS — M1611 Unilateral primary osteoarthritis, right hip: Principal | ICD-10-CM | POA: Diagnosis present

## 2017-03-26 DIAGNOSIS — Z96641 Presence of right artificial hip joint: Secondary | ICD-10-CM | POA: Diagnosis not present

## 2017-03-26 DIAGNOSIS — Z79899 Other long term (current) drug therapy: Secondary | ICD-10-CM | POA: Diagnosis not present

## 2017-03-26 DIAGNOSIS — M25551 Pain in right hip: Secondary | ICD-10-CM | POA: Diagnosis not present

## 2017-03-26 DIAGNOSIS — K648 Other hemorrhoids: Secondary | ICD-10-CM | POA: Diagnosis not present

## 2017-03-26 DIAGNOSIS — Z7982 Long term (current) use of aspirin: Secondary | ICD-10-CM | POA: Diagnosis not present

## 2017-03-26 DIAGNOSIS — Z471 Aftercare following joint replacement surgery: Secondary | ICD-10-CM | POA: Diagnosis not present

## 2017-03-26 DIAGNOSIS — E785 Hyperlipidemia, unspecified: Secondary | ICD-10-CM | POA: Diagnosis not present

## 2017-03-26 DIAGNOSIS — Z96649 Presence of unspecified artificial hip joint: Secondary | ICD-10-CM

## 2017-03-26 HISTORY — PX: TOTAL HIP ARTHROPLASTY: SHX124

## 2017-03-26 LAB — TYPE AND SCREEN
ABO/RH(D): A NEG
Antibody Screen: NEGATIVE

## 2017-03-26 SURGERY — ARTHROPLASTY, HIP, TOTAL, ANTERIOR APPROACH
Anesthesia: Spinal | Site: Hip | Laterality: Right

## 2017-03-26 MED ORDER — HYDROCODONE-ACETAMINOPHEN 7.5-325 MG PO TABS
1.0000 | ORAL_TABLET | ORAL | Status: DC | PRN
  Administered 2017-03-26: 1 via ORAL
  Filled 2017-03-26 (×2): qty 1

## 2017-03-26 MED ORDER — LACTATED RINGERS IV SOLN
INTRAVENOUS | Status: DC
  Administered 2017-03-26 (×2): via INTRAVENOUS

## 2017-03-26 MED ORDER — FENTANYL CITRATE (PF) 100 MCG/2ML IJ SOLN
INTRAMUSCULAR | Status: AC
  Filled 2017-03-26: qty 2

## 2017-03-26 MED ORDER — STERILE WATER FOR IRRIGATION IR SOLN
Status: DC | PRN
  Administered 2017-03-26: 2000 mL

## 2017-03-26 MED ORDER — HYDROCODONE-ACETAMINOPHEN 7.5-325 MG PO TABS: 1.0000 | ORAL_TABLET | ORAL | 0 refills | Status: DC | PRN

## 2017-03-26 MED ORDER — HYDROMORPHONE HCL 1 MG/ML IJ SOLN
0.5000 mg | INTRAMUSCULAR | Status: DC | PRN
  Administered 2017-03-26: 0.5 mg via INTRAVENOUS
  Filled 2017-03-26 (×2): qty 1

## 2017-03-26 MED ORDER — CHLORHEXIDINE GLUCONATE 4 % EX LIQD: 60.0000 mL | Freq: Once | CUTANEOUS | Status: DC

## 2017-03-26 MED ORDER — PHENYLEPHRINE HCL 10 MG/ML IJ SOLN
INTRAMUSCULAR | Status: DC | PRN
  Administered 2017-03-26: 80 ug via INTRAVENOUS
  Administered 2017-03-26 (×3): 120 ug via INTRAVENOUS
  Administered 2017-03-26 (×2): 80 ug via INTRAVENOUS

## 2017-03-26 MED ORDER — CEFAZOLIN SODIUM-DEXTROSE 2-4 GM/100ML-% IV SOLN
2.0000 g | Freq: Four times a day (QID) | INTRAVENOUS | Status: AC
  Administered 2017-03-26 – 2017-03-27 (×2): 2 g via INTRAVENOUS
  Filled 2017-03-26 (×2): qty 100

## 2017-03-26 MED ORDER — CEFAZOLIN SODIUM-DEXTROSE 2-4 GM/100ML-% IV SOLN
2.0000 g | INTRAVENOUS | Status: AC
  Administered 2017-03-26: 2 g via INTRAVENOUS

## 2017-03-26 MED ORDER — CEFAZOLIN SODIUM-DEXTROSE 2-4 GM/100ML-% IV SOLN: 2.0000 g | INTRAVENOUS | Status: DC

## 2017-03-26 MED ORDER — DEXAMETHASONE SODIUM PHOSPHATE 10 MG/ML IJ SOLN
10.0000 mg | Freq: Once | INTRAMUSCULAR | Status: AC
  Administered 2017-03-27: 10 mg via INTRAVENOUS
  Filled 2017-03-26: qty 1

## 2017-03-26 MED ORDER — SUCCINYLCHOLINE CHLORIDE 20 MG/ML IJ SOLN
INTRAMUSCULAR | Status: DC | PRN
  Administered 2017-03-26: 100 mg via INTRAVENOUS

## 2017-03-26 MED ORDER — FENTANYL CITRATE (PF) 100 MCG/2ML IJ SOLN
INTRAMUSCULAR | Status: DC | PRN
Start: 2017-03-26 — End: 2017-03-26
  Administered 2017-03-26: 100 ug via INTRAVENOUS

## 2017-03-26 MED ORDER — PROPOFOL 10 MG/ML IV BOLUS
INTRAVENOUS | Status: AC
  Filled 2017-03-26: qty 20

## 2017-03-26 MED ORDER — POLYETHYLENE GLYCOL 3350 17 G PO PACK: 17.0000 g | PACK | Freq: Two times a day (BID) | ORAL | 0 refills | Status: DC

## 2017-03-26 MED ORDER — DOCUSATE SODIUM 100 MG PO CAPS: 100.0000 mg | ORAL_CAPSULE | Freq: Two times a day (BID) | ORAL | 0 refills | Status: DC

## 2017-03-26 MED ORDER — CEFAZOLIN SODIUM-DEXTROSE 2-4 GM/100ML-% IV SOLN
INTRAVENOUS | Status: AC
  Filled 2017-03-26: qty 100

## 2017-03-26 MED ORDER — MENTHOL 3 MG MT LOZG: 1.0000 | LOZENGE | OROMUCOSAL | Status: DC | PRN

## 2017-03-26 MED ORDER — ASPIRIN 81 MG PO CHEW
81.0000 mg | CHEWABLE_TABLET | Freq: Two times a day (BID) | ORAL | Status: DC
  Administered 2017-03-26 – 2017-03-27 (×2): 81 mg via ORAL
  Filled 2017-03-26 (×2): qty 1

## 2017-03-26 MED ORDER — METOCLOPRAMIDE HCL 5 MG PO TABS: 5.0000 mg | ORAL_TABLET | Freq: Three times a day (TID) | ORAL | Status: DC | PRN

## 2017-03-26 MED ORDER — TRANEXAMIC ACID 1000 MG/10ML IV SOLN
1000.0000 mg | Freq: Once | INTRAVENOUS | Status: AC
  Administered 2017-03-26: 1000 mg via INTRAVENOUS
  Filled 2017-03-26: qty 1100

## 2017-03-26 MED ORDER — METHOCARBAMOL 500 MG PO TABS: 500.0000 mg | ORAL_TABLET | Freq: Four times a day (QID) | ORAL | Status: DC | PRN

## 2017-03-26 MED ORDER — SODIUM CHLORIDE 0.9 % IR SOLN
Status: DC | PRN
  Administered 2017-03-26: 1000 mL

## 2017-03-26 MED ORDER — ASPIRIN 81 MG PO CHEW: 81.0000 mg | CHEWABLE_TABLET | Freq: Two times a day (BID) | ORAL | 0 refills | Status: AC

## 2017-03-26 MED ORDER — FENTANYL CITRATE (PF) 100 MCG/2ML IJ SOLN
25.0000 ug | INTRAMUSCULAR | Status: DC | PRN
  Administered 2017-03-26: 50 ug via INTRAVENOUS

## 2017-03-26 MED ORDER — POLYETHYLENE GLYCOL 3350 17 G PO PACK
17.0000 g | PACK | Freq: Two times a day (BID) | ORAL | Status: DC
  Administered 2017-03-26: 17 g via ORAL
  Filled 2017-03-26: qty 1

## 2017-03-26 MED ORDER — FENTANYL CITRATE (PF) 100 MCG/2ML IJ SOLN
INTRAMUSCULAR | Status: AC
  Administered 2017-03-26: 50 ug via INTRAVENOUS
  Filled 2017-03-26: qty 2

## 2017-03-26 MED ORDER — HYDROCODONE-ACETAMINOPHEN 7.5-325 MG PO TABS
2.0000 | ORAL_TABLET | ORAL | Status: DC | PRN
  Administered 2017-03-26: 2 via ORAL
  Administered 2017-03-26: 1 via ORAL
  Administered 2017-03-27 (×3): 2 via ORAL
  Filled 2017-03-26 (×4): qty 2

## 2017-03-26 MED ORDER — METOCLOPRAMIDE HCL 5 MG/ML IJ SOLN: 5.0000 mg | Freq: Three times a day (TID) | INTRAMUSCULAR | Status: DC | PRN

## 2017-03-26 MED ORDER — ACETAMINOPHEN 650 MG RE SUPP: 650.0000 mg | RECTAL | Status: DC | PRN

## 2017-03-26 MED ORDER — ONDANSETRON HCL 4 MG/2ML IJ SOLN: 4.0000 mg | Freq: Four times a day (QID) | INTRAMUSCULAR | Status: DC | PRN

## 2017-03-26 MED ORDER — CELECOXIB 200 MG PO CAPS
200.0000 mg | ORAL_CAPSULE | Freq: Two times a day (BID) | ORAL | Status: DC
  Administered 2017-03-26 – 2017-03-27 (×2): 200 mg via ORAL
  Filled 2017-03-26 (×2): qty 1

## 2017-03-26 MED ORDER — BISACODYL 10 MG RE SUPP: 10.0000 mg | Freq: Every day | RECTAL | Status: DC | PRN

## 2017-03-26 MED ORDER — SODIUM CHLORIDE 0.9 % IV SOLN
INTRAVENOUS | Status: DC
  Administered 2017-03-26: 100 mL/h via INTRAVENOUS
  Administered 2017-03-27: 06:00:00 via INTRAVENOUS

## 2017-03-26 MED ORDER — MAGNESIUM CITRATE PO SOLN: 1.0000 | Freq: Once | ORAL | Status: DC | PRN

## 2017-03-26 MED ORDER — TRANEXAMIC ACID 1000 MG/10ML IV SOLN
1000.0000 mg | INTRAVENOUS | Status: AC
  Administered 2017-03-26: 1000 mg via INTRAVENOUS
  Filled 2017-03-26: qty 1100

## 2017-03-26 MED ORDER — PHENOL 1.4 % MT LIQD
1.0000 | OROMUCOSAL | Status: DC | PRN
  Filled 2017-03-26: qty 177

## 2017-03-26 MED ORDER — FERROUS SULFATE 325 (65 FE) MG PO TABS
325.0000 mg | ORAL_TABLET | Freq: Three times a day (TID) | ORAL | Status: DC
  Administered 2017-03-27: 325 mg via ORAL
  Filled 2017-03-26: qty 1

## 2017-03-26 MED ORDER — ONDANSETRON HCL 4 MG/2ML IJ SOLN: 4.0000 mg | Freq: Once | INTRAMUSCULAR | Status: DC | PRN

## 2017-03-26 MED ORDER — METHOCARBAMOL 1000 MG/10ML IJ SOLN
500.0000 mg | Freq: Four times a day (QID) | INTRAVENOUS | Status: DC | PRN
  Administered 2017-03-26: 500 mg via INTRAVENOUS
  Filled 2017-03-26: qty 550

## 2017-03-26 MED ORDER — METHOCARBAMOL 500 MG PO TABS: 500.0000 mg | ORAL_TABLET | Freq: Four times a day (QID) | ORAL | 0 refills | Status: DC | PRN

## 2017-03-26 MED ORDER — DOCUSATE SODIUM 100 MG PO CAPS
100.0000 mg | ORAL_CAPSULE | Freq: Two times a day (BID) | ORAL | Status: DC
  Administered 2017-03-26 – 2017-03-27 (×2): 100 mg via ORAL
  Filled 2017-03-26 (×2): qty 1

## 2017-03-26 MED ORDER — GLYCOPYRROLATE 0.2 MG/ML IJ SOLN
INTRAMUSCULAR | Status: DC | PRN
  Administered 2017-03-26: .4 mg via INTRAVENOUS

## 2017-03-26 MED ORDER — ACETAMINOPHEN 325 MG PO TABS: 650.0000 mg | ORAL_TABLET | ORAL | Status: DC | PRN

## 2017-03-26 MED ORDER — ALUM & MAG HYDROXIDE-SIMETH 200-200-20 MG/5ML PO SUSP: 15.0000 mL | ORAL | Status: DC | PRN

## 2017-03-26 MED ORDER — FERROUS SULFATE 325 (65 FE) MG PO TABS: 325.0000 mg | ORAL_TABLET | Freq: Three times a day (TID) | ORAL | 3 refills | Status: DC

## 2017-03-26 MED ORDER — PROPOFOL 500 MG/50ML IV EMUL
INTRAVENOUS | Status: DC | PRN
  Administered 2017-03-26: 60 ug/kg/min via INTRAVENOUS

## 2017-03-26 MED ORDER — ONDANSETRON HCL 4 MG PO TABS: 4.0000 mg | ORAL_TABLET | Freq: Four times a day (QID) | ORAL | Status: DC | PRN

## 2017-03-26 MED ORDER — BUPIVACAINE HCL (PF) 0.5 % IJ SOLN
INTRAMUSCULAR | Status: DC | PRN
  Administered 2017-03-26: 3 mL via INTRATHECAL

## 2017-03-26 MED ORDER — DEXAMETHASONE SODIUM PHOSPHATE 10 MG/ML IJ SOLN
10.0000 mg | Freq: Once | INTRAMUSCULAR | Status: AC
  Administered 2017-03-26: 10 mg via INTRAVENOUS

## 2017-03-26 MED ORDER — PROPOFOL 10 MG/ML IV BOLUS
INTRAVENOUS | Status: DC | PRN
  Administered 2017-03-26: 100 mg via INTRAVENOUS

## 2017-03-26 MED ORDER — DIPHENHYDRAMINE HCL 12.5 MG/5ML PO ELIX: 12.5000 mg | ORAL_SOLUTION | ORAL | Status: DC | PRN

## 2017-03-26 MED ORDER — EPHEDRINE SULFATE 50 MG/ML IJ SOLN
INTRAMUSCULAR | Status: DC | PRN
  Administered 2017-03-26: 15 mg via INTRAVENOUS

## 2017-03-26 MED ORDER — PROPOFOL 10 MG/ML IV BOLUS
INTRAVENOUS | Status: AC
  Filled 2017-03-26: qty 60

## 2017-03-26 SURGICAL SUPPLY — 36 items
ADH SKN CLS APL DERMABOND .7 (GAUZE/BANDAGES/DRESSINGS) ×1
BAG SPEC THK2 15X12 ZIP CLS (MISCELLANEOUS) ×1
BAG ZIPLOCK 12X15 (MISCELLANEOUS) ×2 IMPLANT
BLADE SAG 18X100X1.27 (BLADE) ×3 IMPLANT
CAPT HIP TOTAL 2 ×2 IMPLANT
CLOTH BEACON ORANGE TIMEOUT ST (SAFETY) ×3 IMPLANT
COVER PERINEAL POST (MISCELLANEOUS) ×3 IMPLANT
COVER SURGICAL LIGHT HANDLE (MISCELLANEOUS) ×3 IMPLANT
DERMABOND ADVANCED (GAUZE/BANDAGES/DRESSINGS) ×2
DERMABOND ADVANCED .7 DNX12 (GAUZE/BANDAGES/DRESSINGS) ×1 IMPLANT
DRAPE STERI IOBAN 125X83 (DRAPES) ×3 IMPLANT
DRAPE U-SHAPE 47X51 STRL (DRAPES) ×6 IMPLANT
DRESSING AQUACEL AG SP 3.5X10 (GAUZE/BANDAGES/DRESSINGS) ×1 IMPLANT
DRSG AQUACEL AG SP 3.5X10 (GAUZE/BANDAGES/DRESSINGS) ×3
DURAPREP 26ML APPLICATOR (WOUND CARE) ×3 IMPLANT
ELECT REM PT RETURN 15FT ADLT (MISCELLANEOUS) ×3 IMPLANT
GLOVE BIO SURGEON STRL SZ7 (GLOVE) ×2 IMPLANT
GLOVE BIOGEL M STRL SZ7.5 (GLOVE) ×6 IMPLANT
GLOVE BIOGEL PI IND STRL 7.0 (GLOVE) IMPLANT
GLOVE BIOGEL PI IND STRL 7.5 (GLOVE) ×1 IMPLANT
GLOVE BIOGEL PI INDICATOR 7.0 (GLOVE) ×2
GLOVE BIOGEL PI INDICATOR 7.5 (GLOVE) ×10
GLOVE ORTHO TXT STRL SZ7.5 (GLOVE) ×7 IMPLANT
GLOVE SURG SS PI 7.0 STRL IVOR (GLOVE) ×2 IMPLANT
GOWN STRL REUS W/TWL LRG LVL3 (GOWN DISPOSABLE) ×6 IMPLANT
GOWN STRL REUS W/TWL XL LVL3 (GOWN DISPOSABLE) ×5 IMPLANT
HOLDER FOLEY CATH W/STRAP (MISCELLANEOUS) ×3 IMPLANT
PACK ANTERIOR HIP CUSTOM (KITS) ×3 IMPLANT
SUT MNCRL AB 4-0 PS2 18 (SUTURE) ×3 IMPLANT
SUT STRATAFIX 0 PDS 27 VIOLET (SUTURE) ×3
SUT VIC AB 1 CT1 36 (SUTURE) ×9 IMPLANT
SUT VIC AB 2-0 CT1 27 (SUTURE) ×6
SUT VIC AB 2-0 CT1 TAPERPNT 27 (SUTURE) ×2 IMPLANT
SUTURE STRATFX 0 PDS 27 VIOLET (SUTURE) ×1 IMPLANT
TRAY FOLEY W/METER SILVER 16FR (SET/KITS/TRAYS/PACK) ×2 IMPLANT
YANKAUER SUCT BULB TIP 10FT TU (MISCELLANEOUS) IMPLANT

## 2017-03-26 NOTE — Transfer of Care (Signed)
Immediate Anesthesia Transfer of Care Note  Patient: John Trujillo  Procedure(s) Performed: RIGHT TOTAL HIP ARTHROPLASTY ANTERIOR APPROACH (Right Hip)  Patient Location: PACU  Anesthesia Type:General  Level of Consciousness: awake, alert  and oriented  Airway & Oxygen Therapy: Patient Spontanous Breathing and Patient connected to face mask oxygen  Post-op Assessment: Report given to RN and Post -op Vital signs reviewed and stable  Post vital signs: Reviewed and stable  Last Vitals:  Vitals:   03/26/17 1209 03/26/17 1600  BP: (!) 155/80 (P) 117/81  Pulse: 69 (P) 80  Resp: 16 (P) 15  Temp: 36.6 C (!) (P) 36.4 C  SpO2: 99% (P) 99%    Last Pain:  Vitals:   03/26/17 1209  TempSrc: Oral         Complications: No apparent anesthesia complications

## 2017-03-26 NOTE — Anesthesia Preprocedure Evaluation (Signed)
Anesthesia Evaluation  Patient identified by MRN, date of birth, ID band Patient awake    Reviewed: Allergy & Precautions, NPO status , Patient's Chart, lab work & pertinent test results  Airway Mallampati: II  TM Distance: >3 FB Neck ROM: Full    Dental  (+) Teeth Intact, Dental Advisory Given, Caps   Pulmonary neg pulmonary ROS,    Pulmonary exam normal breath sounds clear to auscultation       Cardiovascular Exercise Tolerance: Good negative cardio ROS Normal cardiovascular exam Rhythm:Regular Rate:Normal     Neuro/Psych negative neurological ROS  negative psych ROS   GI/Hepatic negative GI ROS, Neg liver ROS,   Endo/Other  negative endocrine ROS  Renal/GU negative Renal ROS     Musculoskeletal  (+) Arthritis , Osteoarthritis,    Abdominal   Peds  Hematology negative hematology ROS (+)   Anesthesia Other Findings Day of surgery medications reviewed with the patient.  Reproductive/Obstetrics                             Anesthesia Physical Anesthesia Plan  ASA: II  Anesthesia Plan: Spinal   Post-op Pain Management:    Induction: Intravenous  PONV Risk Score and Plan: 1 and Propofol infusion, Ondansetron and Midazolam  Airway Management Planned: Simple Face Mask  Additional Equipment:   Intra-op Plan:   Post-operative Plan:   Informed Consent: I have reviewed the patients History and Physical, chart, labs and discussed the procedure including the risks, benefits and alternatives for the proposed anesthesia with the patient or authorized representative who has indicated his/her understanding and acceptance.   Dental advisory given  Plan Discussed with: CRNA, Anesthesiologist and Surgeon  Anesthesia Plan Comments: (Discussed risks and benefits of and differences between spinal and general. Discussed risks of spinal including headache, backache, failure, bleeding, infection,  and nerve damage. Patient consents to spinal. Questions answered. Coagulation studies and platelet count acceptable.)        Anesthesia Quick Evaluation

## 2017-03-26 NOTE — Anesthesia Procedure Notes (Signed)
Procedure Name: Intubation Performed by: Gean Maidens, CRNA Pre-anesthesia Checklist: Patient identified, Emergency Drugs available, Suction available, Patient being monitored and Timeout performed Patient Re-evaluated:Patient Re-evaluated prior to induction Oxygen Delivery Method: Circle system utilized Preoxygenation: Pre-oxygenation with 100% oxygen Induction Type: IV induction Ventilation: Mask ventilation without difficulty Laryngoscope Size: Mac and 4 Grade View: Grade III Tube type: Oral Tube size: 7.5 mm Number of attempts: 1 Airway Equipment and Method: Stylet Placement Confirmation: ETT inserted through vocal cords under direct vision,  positive ETCO2,  CO2 detector and breath sounds checked- equal and bilateral Secured at: 23 cm Tube secured with: Tape Dental Injury: Teeth and Oropharynx as per pre-operative assessment

## 2017-03-26 NOTE — Anesthesia Procedure Notes (Signed)
Spinal  Patient location during procedure: OR Start time: 03/26/2017 1:54 PM End time: 03/26/2017 1:56 PM Staffing Anesthesiologist: Catalina Gravel, MD Performed: anesthesiologist  Preanesthetic Checklist Completed: patient identified, surgical consent, pre-op evaluation, timeout performed, IV checked, risks and benefits discussed and monitors and equipment checked Spinal Block Patient position: sitting Prep: site prepped and draped and DuraPrep Patient monitoring: continuous pulse ox and blood pressure Approach: midline Location: L3-4 Injection technique: single-shot Needle Needle type: Quincke  Needle gauge: 22 G Needle length: 9 cm Additional Notes Attempts by CRNA and Dr. Sabra Heck.  Attempt x1 by Dr Gifford Shave.

## 2017-03-26 NOTE — Interval H&P Note (Signed)
History and Physical Interval Note:  03/26/2017 12:27 PM  John Trujillo  has presented today for surgery, with the diagnosis of Right hip osteoarthritis  The various methods of treatment have been discussed with the patient and family. After consideration of risks, benefits and other options for treatment, the patient has consented to  Procedure(s) with comments: RIGHT TOTAL HIP ARTHROPLASTY ANTERIOR APPROACH (Right) - 70 mins as a surgical intervention .  The patient's history has been reviewed, patient examined, no change in status, stable for surgery.  I have reviewed the patient's chart and labs.  Questions were answered to the patient's satisfaction.     Mauri Pole

## 2017-03-26 NOTE — Discharge Instructions (Signed)

## 2017-03-26 NOTE — Op Note (Signed)
NAME:  John Trujillo                ACCOUNT NO.: [id number]      MEDICAL RECORD NO.: [id number]      FACILITY:  [facility]      PHYSICIAN:  Mauri Pole  DATE OF BIRTH:  [birth year]     DATE OF PROCEDURE:  03/26/2017                                 OPERATIVE REPORT         PREOPERATIVE DIAGNOSIS: Right  hip osteoarthritis.      POSTOPERATIVE DIAGNOSIS:  Right hip osteoarthritis.      PROCEDURE:  Right total hip replacement through an anterior approach   utilizing DePuy THR system, component size 56 mm pinnacle cup, a size 36+4 neutral   Altrex liner, a size 6 Hi Tri Lock stem with a 36+1.5 delta ceramic   ball.      SURGEON:  Pietro Cassis. Alvan Dame, M.D.      ASSISTANT:  Nehemiah Massed, PA-C     ANESTHESIA:  General and Spinal.      SPECIMENS:  None.      COMPLICATIONS:  None.      BLOOD LOSS:  300 cc     DRAINS:   None      INDICATION OF THE PROCEDURE:  John Trujillo is a 71 y.o. male who had   presented to office for evaluation of right hip pain.  Radiographs revealed   progressive degenerative changes with bone-on-bone   articulation to the  hip joint.  The patient had painful limited range of   motion significantly affecting their overall quality of life.  The patient was failing to    respond to conservative measures, and at this point was ready   to proceed with more definitive measures.  The patient has noted progressive   degenerative changes in his hip, progressive problems and dysfunction   with regarding the hip prior to surgery.  Consent was obtained for   benefit of pain relief.  Specific risk of infection, DVT, component   failure, dislocation, need for revision surgery, as well discussion of   the anterior versus posterior approach were reviewed.  Consent was   obtained for benefit of anterior pain relief through an anterior   approach.      PROCEDURE IN DETAIL:  The patient was brought to operative theater.   Once adequate  anesthesia, preoperative antibiotics, 2 gm of Ancef, 1 gm of Tranexamic Acid, and 10 mg of Decadron administered.   The patient was positioned supine on the OSI Hanna table.  Once adequate   padding of boney process was carried out, we had predraped out the hip, and  used fluoroscopy to confirm orientation of the pelvis and position.      The right hip was then prepped and draped from proximal iliac crest to   mid thigh with shower curtain technique.      Time-out was performed identifying the patient, planned procedure, and   extremity.     An incision was then made 2 cm distal and lateral to the   anterior superior iliac spine extending over the orientation of the   tensor fascia lata muscle and sharp dissection was carried down to the   fascia of the muscle and protractor placed in the soft tissues.  The fascia was then incised.  The muscle belly was identified and swept   laterally and retractor placed along the superior neck.  Following   cauterization of the circumflex vessels and removing some pericapsular   fat, a second cobra retractor was placed on the inferior neck.  A third   retractor was placed on the anterior acetabulum after elevating the   anterior rectus.  A L-capsulotomy was along the line of the   superior neck to the trochanteric fossa, then extended proximally and   distally.  Tag sutures were placed and the retractors were then placed   intracapsular.  We then identified the trochanteric fossa and   orientation of my neck cut, confirmed this radiographically   and then made a neck osteotomy with the femur on traction.  The femoral   head was removed without difficulty or complication.  Traction was let   off and retractors were placed posterior and anterior around the   acetabulum.      The labrum and foveal tissue were debrided.  I began reaming with a 18mm mm   reamer and reamed up to 55 mm reamer with good bony bed preparation and a 56 mm cup was chosen.   The final 56 mm Pinnacle cup was then impacted under fluoroscopy  to confirm the depth of penetration and orientation with respect to   abduction.  A screw was placed followed by the hole eliminator.  The final   36 +4 neutral Altrex liner was impacted with good visualized rim fit.  The cup was positioned anatomically within the acetabular portion of the pelvis.      At this point, the femur was rolled at 80 degrees.  Further capsule was   released off the inferior aspect of the femoral neck.  I then   released the superior capsule proximally.  The hook was placed laterally   along the femur and elevated manually and held in position with the bed   hook.  The leg was then extended and adducted with the leg rolled to 100   degrees of external rotation.  Once the proximal femur was fully   exposed, I used a box osteotome to set orientation.  I then began   broaching with the starting chili pepper broach and passed this by hand and then broached up to 6.  With the 6 broach in place I chose a high offset neck and did several trial reductions.  The offset was appropriate, leg lengths   appeared to be equal best matched with the +1.5 head ball confirmed radiographically.   Given these findings, I went ahead and dislocated the hip, repositioned all   retractors and positioned the right hip in the extended and abducted position.  The final 6Hi Tri Lock stem was   chosen and it was impacted down to the level of neck cut.  Based on this   and the trial reduction, a 36+1.5 delta ceramic ball was chosen and   impacted onto a clean and dry trunnion, and the hip was reduced.  The   hip had been irrigated throughout the case again at this point.  I did   reapproximate the superior capsular leaflet to the anterior leaflet   using #1 Vicryl.  The fascia of the   tensor fascia lata muscle was then reapproximated using #1 Vicryl and #0 Stratafix sutures.  The   remaining wound was closed with 2-0 Vicryl and  running 4-0 Monocryl.   The  hip was cleaned, dried, and dressed sterilely using Dermabond and   Aquacel dressing.  He was then brought   to recovery room in stable condition tolerating the procedure well.    Nehemiah Massed, PA-C was present for the entirety of the case involved from   preoperative positioning, perioperative retractor management, general   facilitation of the case, as well as primary wound closure as assistant.            Pietro Cassis Alvan Dame, M.D.        03/26/2017 2:10 PM

## 2017-03-27 LAB — CBC
HEMATOCRIT: 36.8 % — AB (ref 39.0–52.0)
HEMOGLOBIN: 12.4 g/dL — AB (ref 13.0–17.0)
MCH: 31.2 pg (ref 26.0–34.0)
MCHC: 33.7 g/dL (ref 30.0–36.0)
MCV: 92.5 fL (ref 78.0–100.0)
Platelets: 186 10*3/uL (ref 150–400)
RBC: 3.98 MIL/uL — AB (ref 4.22–5.81)
RDW: 13 % (ref 11.5–15.5)
WBC: 5.3 10*3/uL (ref 4.0–10.5)

## 2017-03-27 LAB — BASIC METABOLIC PANEL
Anion gap: 7 (ref 5–15)
BUN: 10 mg/dL (ref 6–20)
CHLORIDE: 100 mmol/L — AB (ref 101–111)
CO2: 27 mmol/L (ref 22–32)
CREATININE: 0.91 mg/dL (ref 0.61–1.24)
Calcium: 8.1 mg/dL — ABNORMAL LOW (ref 8.9–10.3)
GFR calc non Af Amer: >60 mL/min (ref >60)
Glucose, Bld: 102 mg/dL — ABNORMAL HIGH (ref 65–99)
POTASSIUM: 3.6 mmol/L (ref 3.5–5.1)
SODIUM: 134 mmol/L — AB (ref 135–145)

## 2017-03-27 NOTE — Evaluation (Signed)
Occupational Therapy Evaluation Patient Details Name: John Trujillo MRN: [id number] DOB: [birth year] Today's Date: 03/27/2017    History of Present Illness s/p R DA THA   Clinical Impression   This 71 year old man was admitted for the above sx. All education was completed.     Follow Up Recommendations  Supervision/Assistance - 24 hour    Equipment Recommendations  None recommended by OT (pt would benefit from toilet DME; doesn't want)    Recommendations for Other Services       Precautions / Restrictions Precautions Precautions: Fall Restrictions Weight Bearing Restrictions: No      Mobility Bed Mobility               General bed mobility comments: oob  Transfers Overall transfer level: Needs assistance Equipment used: Rolling walker (2 wheeled) Transfers: Sit to/from Stand Sit to Stand: Min guard;Min assist         General transfer comment: min guard from chair; min A from comfort height commode without bar/counter    Balance                                           ADL either performed or assessed with clinical judgement   ADL Overall ADL's : Needs assistance/impaired Eating/Feeding: Independent   Grooming: Supervision/safety;Standing   Upper Body Bathing: Set up;Sitting   Lower Body Bathing: Moderate assistance;Sit to/from stand   Upper Body Dressing : Set up;Sitting   Lower Body Dressing: Maximal assistance;Sit to/from stand   Toilet Transfer: Minimal assistance;Regular Toilet;RW;Ambulation   Toileting- Water quality scientist and Hygiene: Min guard;Sit to/from stand   Tub/ Shower Transfer: Walk-in shower;Min guard;Ambulation     General ADL Comments: pt does not want 3;1 commode. Explained that this could go over commode without bucket and would be smaller than what we have in his room.  Practiced transfer onto comfort height commode, and his is a standard commode.  He has nothing to push up from.  Educated that  pt could get an elevated toilet seat at drug store or medical supply.  Really feel he would benefit from some DME over commode.  Gave him a handout on shower sequence     Vision         Perception     Praxis      Pertinent Vitals/Pain Pain Assessment: Faces Faces Pain Scale: Hurts little more Pain Location: right hip Pain Descriptors / Indicators: Tightness;Sore;Aching Pain Intervention(s): Limited activity within patient's tolerance;Monitored during session;Premedicated before session;Repositioned;Ice applied     Hand Dominance     Extremity/Trunk Assessment Upper Extremity Assessment Upper Extremity Assessment: Overall WFL for tasks assessed           Communication Communication Communication: No difficulties   Cognition Arousal/Alertness: Awake/alert Behavior During Therapy: WFL for tasks assessed/performed Overall Cognitive Status: Within Functional Limits for tasks assessed                                     General Comments       Exercises     Shoulder Instructions      Home Living Family/patient expects to be discharged to:: Private residence Living Arrangements: Spouse/significant other Available Help at Discharge: Family               Bathroom Shower/Tub: Walk-in shower  Bathroom Toilet: Standard         Additional Comments: pt does not have anything to push up from.  Pt does have a built in shower seat      Prior Functioning/Environment Level of Independence: Independent                 OT Problem List:        OT Treatment/Interventions:      OT Goals(Current goals can be found in the care plan section) Acute Rehab OT Goals Patient Stated Goal: return to independence OT Goal Formulation: All assessment and education complete, DC therapy  OT Frequency:     Barriers to D/C:            Co-evaluation              AM-PAC PT "6 Clicks" Daily Activity     Outcome Measure Help from another person  eating meals?: None Help from another person taking care of personal grooming?: A Little Help from another person toileting, which includes using toliet, bedpan, or urinal?: A Little Help from another person bathing (including washing, rinsing, drying)?: A Lot Help from another person to put on and taking off regular upper body clothing?: A Little Help from another person to put on and taking off regular lower body clothing?: A Lot 6 Click Score: 17   End of Session    Activity Tolerance: Patient tolerated treatment well Patient left: in chair;with call bell/phone within reach;with family/visitor present(wife came at end of session)  OT Visit Diagnosis: Pain Pain - Right/Left: Right Pain - part of body: Hip                Time: 1443-1540 OT Time Calculation (min): 21 min Charges:  OT General Charges $OT Visit: 1 Visit OT Evaluation $OT Eval Low Complexity: 1 Low G-Codes:     Adams, OTR/L [phone] 03/27/2017  Elaina Cara 03/27/2017, 11:03 AM

## 2017-03-27 NOTE — Anesthesia Postprocedure Evaluation (Signed)
Anesthesia Post Note  Patient: John Trujillo  Procedure(s) Performed: RIGHT TOTAL HIP ARTHROPLASTY ANTERIOR APPROACH (Right Hip)     Patient location during evaluation: PACU Anesthesia Type: Spinal and General Level of consciousness: awake and alert Pain management: pain level controlled Vital Signs Assessment: post-procedure vital signs reviewed and stable Respiratory status: spontaneous breathing, nonlabored ventilation, respiratory function stable and patient connected to nasal cannula oxygen Cardiovascular status: blood pressure returned to baseline and stable Postop Assessment: no apparent nausea or vomiting Anesthetic complications: yes Anesthetic complication details: anesthesia complicationsComments: Cap damaged on right upper incisor.  Unknown when damage occurred.      Last Vitals:  Vitals:   03/27/17 0604 03/27/17 0939  BP: 117/68 113/67  Pulse: 77 69  Resp: 14 14  Temp: 36.7 C 36.6 C  SpO2: 98% 95%    Last Pain:  Vitals:   03/27/17 1218  TempSrc:   PainSc: [place]

## 2017-03-27 NOTE — Progress Notes (Signed)
Discharge planning, no HH needs identified. Plan for no PT, has DME. [phone] 

## 2017-03-27 NOTE — Progress Notes (Signed)
     Subjective: 1 Day Post-Op Procedure(s) (LRB): RIGHT TOTAL HIP ARTHROPLASTY ANTERIOR APPROACH (Right)   Patient reports pain as mild, pain controlled. No events throughout the night. Discussed incident with intubation due to secretions and fear of aspiration.  Feeling good this morning. Planning on the other hip in January.  Ready to be discharged home.   Objective:   VITALS:   Vitals:   03/27/17 0205 03/27/17 0604  BP: 113/73 117/68  Pulse: 88 77  Resp: 15 14  Temp: 98.3 F (36.8 C) 98.1 F (36.7 C)  SpO2: 97% 98%    Dorsiflexion/Plantar flexion intact Incision: dressing C/D/I No cellulitis present Compartment soft  LABS Recent Labs    03/27/17 0702  HGB 12.4*  HCT 36.8*  WBC 5.3  PLT 186    Recent Labs    03/27/17 0702  NA 134*  K 3.6  BUN 10  CREATININE 0.91  GLUCOSE 102*     Assessment/Plan: 1 Day Post-Op Procedure(s) (LRB): RIGHT TOTAL HIP ARTHROPLASTY ANTERIOR APPROACH (Right) Foley cath d/c'ed Advance diet Up with therapy D/C IV fluids Discharge home Follow up in 2 weeks at [facility]. Follow up with OLIN,Jamon Hay[place] D in 2 weeks.  Contact information:  [facility] [address] [place] [address] Neiko Trivedi   PAC  03/27/2017, 8:16 AM

## 2017-03-27 NOTE — Evaluation (Signed)
Physical Therapy Evaluation Patient Details Name: John Trujillo MRN: [id number] DOB: [birth year] Today's Date: 03/27/2017   History of Present Illness  s/p R DA THA  Clinical Impression  Pt is s/p THA resulting in the deficits listed below (see PT Problem List).  Pt will benefit from skilled PT to increase their independence and safety with mobility to allow discharge to the venue listed below.  Pt ambulated in hallway and performed LE exercises.  Pt plans to d/c home today with spouse so will return for practicing steps in second session.     Follow Up Recommendations DC plan and follow up therapy as arranged by surgeon    Equipment Recommendations  None recommended by PT    Recommendations for Other Services       Precautions / Restrictions Precautions Precautions: Fall Restrictions Weight Bearing Restrictions: No Other Position/Activity Restrictions: WBAT      Mobility  Bed Mobility Overal bed mobility: Needs Assistance Bed Mobility: Supine to Sit     Supine to sit: Min assist     General bed mobility comments: slight assist for R LE support  Transfers Overall transfer level: Needs assistance Equipment used: Rolling walker (2 wheeled) Transfers: Sit to/from Stand Sit to Stand: Min guard         General transfer comment: verbal cues for UE and LE positioning  Ambulation/Gait Ambulation/Gait assistance: Min guard Ambulation Distance (Feet): 160 Feet Assistive device: Rolling walker (2 wheeled) Gait Pattern/deviations: Step-through pattern;Decreased stride length;Antalgic     General Gait Details: verbal cues for sequence, RW positioning, step length  Stairs            Wheelchair Mobility    Modified Rankin (Stroke Patients Only)       Balance                                             Pertinent Vitals/Pain Pain Assessment: 0-10 Pain Score: 4  Faces Pain Scale: Hurts little more Pain Location: right hip Pain  Descriptors / Indicators: Tightness;Sore;Aching Pain Intervention(s): Repositioned;Limited activity within patient's tolerance;Monitored during session;Ice applied    Home Living Family/patient expects to be discharged to:: Private residence Living Arrangements: Spouse/significant other Available Help at Discharge: Family Type of Home: House Home Access: Stairs to enter Entrance Stairs-Rails: None Entrance Stairs-Number of Steps: 3 Home Layout: Able to live on main level with bedroom/bathroom Home Equipment: Walker - 2 wheels Additional Comments: pt does not have anything to push up from    Prior Function Level of Independence: Independent               Hand Dominance        Extremity/Trunk Assessment   Upper Extremity Assessment Upper Extremity Assessment: Overall WFL for tasks assessed    Lower Extremity Assessment Lower Extremity Assessment: RLE deficits/detail RLE Deficits / Details: anticipated post op hip weakness       Communication   Communication: No difficulties  Cognition Arousal/Alertness: Awake/alert Behavior During Therapy: WFL for tasks assessed/performed Overall Cognitive Status: Within Functional Limits for tasks assessed                                        General Comments      Exercises Total Joint Exercises Hip ABduction/ADduction: AROM;10 reps;Right;Standing;Supine Long Arc  Quad: AROM;Right;10 reps;Seated Knee Flexion: AROM;10 reps;Right;Standing Marching in Standing: AROM;Right;10 reps;Seated;Standing Standing Hip Extension: AROM;10 reps;Right;Standing   Assessment/Plan    PT Assessment Patient needs continued PT services  PT Problem List Decreased strength;Decreased mobility;Decreased knowledge of use of DME;Pain       PT Treatment Interventions Functional mobility training;Gait training;DME instruction;Therapeutic activities;Therapeutic exercise;Stair training;Patient/family education    PT Goals (Current  goals can be found in the Care Plan section)  Acute Rehab PT Goals Patient Stated Goal: return to independence PT Goal Formulation: With patient Time For Goal Achievement: 03/30/17 Potential to Achieve Goals: Good    Frequency 7X/week   Barriers to discharge        Co-evaluation               AM-PAC PT "6 Clicks" Daily Activity  Outcome Measure Difficulty turning over in bed (including adjusting bedclothes, sheets and blankets)?: A Little Difficulty moving from lying on back to sitting on the side of the bed? : Unable Difficulty sitting down on and standing up from a chair with arms (e.g., wheelchair, bedside commode, etc,.)?: Unable Help needed moving to and from a bed to chair (including a wheelchair)?: A Little Help needed walking in hospital room?: A Little Help needed climbing 3-5 steps with a railing? : A Little 6 Click Score: 14    End of Session Equipment Utilized During Treatment: Gait belt Activity Tolerance: Patient tolerated treatment well Patient left: in chair;with call bell/phone within reach   PT Visit Diagnosis: Other abnormalities of gait and mobility (R26.89)    Time: 9518-8416 PT Time Calculation (min) (ACUTE ONLY): 19 min   Charges:   PT Evaluation $PT Eval Low Complexity: 1 Low     PT G CodesCarmelia Bake, PT, DPT 03/27/2017 Pager: [phone]   York Ram E 03/27/2017, 11:15 AM

## 2017-03-27 NOTE — Progress Notes (Signed)
Physical Therapy Treatment Patient Details Name: John Trujillo MRN: [id number] DOB: [birth year] Today's Date: 03/27/2017    History of Present Illness s/p R DA THA    PT Comments    Pt ambulated in hallway and spouse assisted with holding RW for stair training.   Pt and spouse feel ready for d/c home today.  Provided HEP handout and pt had no further questions.  Follow Up Recommendations  DC plan and follow up therapy as arranged by surgeon     Equipment Recommendations  None recommended by PT    Recommendations for Other Services       Precautions / Restrictions Precautions Precautions: Fall Restrictions Weight Bearing Restrictions: No Other Position/Activity Restrictions: WBAT    Mobility  Bed Mobility Overal bed mobility: Needs Assistance Bed Mobility: Supine to Sit     Supine to sit: Min assist     General bed mobility comments: slight assist for R LE support  Transfers Overall transfer level: Needs assistance Equipment used: Rolling walker (2 wheeled) Transfers: Sit to/from Stand Sit to Stand: Supervision         General transfer comment: verbal cues for UE and LE positioning  Ambulation/Gait Ambulation/Gait assistance: Supervision;Min guard Ambulation Distance (Feet): 120 Feet Assistive device: Rolling walker (2 wheeled) Gait Pattern/deviations: Step-through pattern;Decreased stride length;Antalgic     General Gait Details: verbal cues for sequence, RW positioning, step length   Stairs Stairs: Yes   Stair Management: Step to pattern;Backwards;With walker Number of Stairs: 2 General stair comments: verbal cues for sequence, RW positioning, technique, safety, spouse present and assisted with holding RW  Wheelchair Mobility    Modified Rankin (Stroke Patients Only)       Balance                                            Cognition Arousal/Alertness: Awake/alert Behavior During Therapy: WFL for tasks  assessed/performed Overall Cognitive Status: Within Functional Limits for tasks assessed                                        Exercises     General Comments        Pertinent Vitals/Pain Pain Assessment: 0-10 Pain Score: 4  Faces Pain Scale: Hurts little more Pain Location: right hip Pain Descriptors / Indicators: Tightness;Sore;Aching Pain Intervention(s): Ice applied;Limited activity within patient's tolerance;Monitored during session    Home Living Family/patient expects to be discharged to:: Private residence Living Arrangements: Spouse/significant other Available Help at Discharge: Family Type of Home: House Home Access: Stairs to enter Entrance Stairs-Rails: None Home Layout: Able to live on main level with bedroom/bathroom Home Equipment: Environmental consultant - 2 wheels Additional Comments: pt does not have anything to push up from    Prior Function Level of Independence: Independent          PT Goals (current goals can now be found in the care plan section) Acute Rehab PT Goals Patient Stated Goal: return to independence PT Goal Formulation: With patient Time For Goal Achievement: 03/30/17 Potential to Achieve Goals: Good Progress towards PT goals: Progressing toward goals    Frequency    7X/week      PT Plan Current plan remains appropriate    Co-evaluation  AM-PAC PT "6 Clicks" Daily Activity  Outcome Measure  Difficulty turning over in bed (including adjusting bedclothes, sheets and blankets)?: A Little Difficulty moving from lying on back to sitting on the side of the bed? : A Little Difficulty sitting down on and standing up from a chair with arms (e.g., wheelchair, bedside commode, etc,.)?: A Little Help needed moving to and from a bed to chair (including a wheelchair)?: A Little Help needed walking in hospital room?: A Little Help needed climbing 3-5 steps with a railing? : A Little 6 Click Score: 18    End of  Session Equipment Utilized During Treatment: Gait belt Activity Tolerance: Patient tolerated treatment well Patient left: in chair;with call bell/phone within reach;with family/visitor present   PT Visit Diagnosis: Other abnormalities of gait and mobility (R26.89)     Time: 4888-9169 PT Time Calculation (min) (ACUTE ONLY): 12 min  Charges:  $Gait Training: 8-22 mins                    G Codes:      Carmelia Bake, PT, DPT 03/27/2017 Pager: [phone]  York Ram E 03/27/2017, 12:25 PM

## 2017-04-01 NOTE — Discharge Summary (Signed)
Physician Discharge Summary  Patient ID: John Trujillo MRN: [id number] DOB/AGE: 70-28-1947 70 y.o.  Admit date: 03/26/2017 Discharge date: 03/27/2017   Procedures:  Procedure(s) (LRB): RIGHT TOTAL HIP ARTHROPLASTY ANTERIOR APPROACH (Right)  Attending Physician:  Dr. Paralee Cancel   Admission Diagnoses:   Right hip primary OA / pain  Discharge Diagnoses:  Principal Problem:   S/P right THA, AA Active Problems:   S/P hip replacement  Past Medical History:  Diagnosis Date  . Arthritis    in knees, has seen Dr. Wynelle Link   . 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 History of hemorrhoids   . Prostatitis   . Pyelonephritis    history of urinary retention  . Tubulovillous adenoma of colon     HPI:    John Trujillo, 71 y.o. male, has a history of pain and functional disability in the right hip(s) due to arthritis and patient has failed non-surgical conservative treatments for greater than 12 weeks to include NSAID's and/or analgesics and activity modification.  Onset of symptoms was gradual starting >10 years ago with gradually worsening course since that time.The patient noted no past surgery on the right hip(s).  Patient currently rates pain in the right hip at 8 out of 10 with activity. Patient has night pain, worsening of pain with activity and weight bearing, trendelenberg gait, pain that interfers with activities of daily living and pain with passive range of motion. Patient has evidence of periarticular osteophytes and joint space narrowing by imaging studies. This condition presents safety issues increasing the risk of falls.  There is no current active infection.  Risks, benefits and expectations were discussed with the patient.  Risks including but not limited to the risk of anesthesia, blood clots, nerve damage, blood vessel damage, failure of the prosthesis, infection and up to and including death.  Patient understand the  risks, benefits and expectations and wishes to proceed with surgery  PCP: Laurey Morale, MD   Discharged Condition: good  Hospital Course:  Patient underwent the above stated procedure on 03/26/2017. Patient tolerated the procedure well and brought to the recovery room in good condition and subsequently to the floor.  POD #1 BP: 117/68 ; Pulse: 77 ; Temp: 98.1 F (36.7 C) ; Resp: 14 Patient reports pain as mild, pain controlled. No events throughout the night. Discussed incident with intubation due to secretions and fear of aspiration.  Feeling good this morning. Planning on the other hip in January.  Ready to be discharged home.  Dorsiflexion/plantar flexion intact, incision: dressing C/D/I, no cellulitis present and compartment soft.   LABS  Basename    HGB     12.4  HCT     36.8    Discharge Exam: General appearance: alert, cooperative and no distress Extremities: Homans sign is negative, no sign of DVT, no edema, redness or tenderness in the calves or thighs and no ulcers, gangrene or trophic changes  Disposition: Home with follow up in 2 weeks   Follow-up Information    Paralee Cancel, MD. Schedule an appointment as soon as possible for a visit in 2 week(s).   Specialty:  Orthopedic Surgery Contact information: [address] [place] [address] [phone]           Discharge Instructions    Call MD / Call 911   Complete by:  As directed    If you experience chest pain or shortness of breath,  CALL 911 and be transported to the hospital emergency room.  If you develope a fever above 101 F, pus (white drainage) or increased drainage or redness at the wound, or calf pain, call your surgeon's office.   Change dressing   Complete by:  As directed    Maintain surgical dressing until follow up in the clinic. If the edges start to pull up, may reinforce with tape. If the dressing is no longer working, may remove and cover with gauze and tape, but must  keep the area dry and clean.  Call with any questions or concerns.   Constipation Prevention   Complete by:  As directed    Drink plenty of fluids.  Prune juice may be helpful.  You may use a stool softener, such as Colace (over the counter) 100 mg twice a day.  Use MiraLax (over the counter) for constipation as needed.   Diet - low sodium heart healthy   Complete by:  As directed    Discharge instructions   Complete by:  As directed    Maintain surgical dressing until follow up in the clinic. If the edges start to pull up, may reinforce with tape. If the dressing is no longer working, may remove and cover with gauze and tape, but must keep the area dry and clean.  Follow up in 2 weeks at [facility]. Call with any questions or concerns.   Increase activity slowly as tolerated   Complete by:  As directed    Weight bearing as tolerated with assist device (walker, cane, etc) as directed, use it as long as suggested by your surgeon or therapist, typically at least 4-6 weeks.   TED hose   Complete by:  As directed    Use stockings (TED hose) for 2 weeks on both leg(s).  You may remove them at night for sleeping.      Allergies as of 03/27/2017   No Known Allergies     Medication List    STOP taking these medications   aspirin EC 81 MG tablet Replaced by:  aspirin 81 MG chewable tablet   naproxen sodium 220 MG tablet Commonly known as:  ALEVE     TAKE these medications   aspirin 81 MG chewable tablet Commonly known as:  ASPIRIN CHILDRENS Chew 1 tablet (81 mg total) by mouth 2 (two) times daily. Replaces:  aspirin EC 81 MG tablet   docusate sodium 100 MG capsule Commonly known as:  COLACE Take 1 capsule (100 mg total) by mouth 2 (two) times daily.   ferrous sulfate 325 (65 FE) MG tablet Commonly known as:  FERROUSUL Take 1 tablet (325 mg total) by mouth 3 (three) times daily with meals.   FISH OIL PO Take 1 capsule daily by mouth.   GLUCOSAMINE-CHONDROITIN  PO Take 1 capsule daily by mouth.   HYDROcodone-acetaminophen 7.5-325 MG tablet Commonly known as:  NORCO Take 1-2 tablets by mouth every 4 (four) hours as needed for moderate pain or severe pain.   methocarbamol 500 MG tablet Commonly known as:  ROBAXIN Take 1 tablet (500 mg total) by mouth every 6 (six) hours as needed for muscle spasms.   polyethylene glycol packet Commonly known as:  MIRALAX / GLYCOLAX Take 17 g by mouth 2 (two) times daily.   sildenafil 100 MG tablet Commonly known as:  VIAGRA Take 1 tablet (100 mg total) daily as needed by mouth for erectile dysfunction. What changed:  how much to take  Discharge Care Instructions  (From admission, onward)        Start     Ordered   03/27/17 0000  Change dressing    Comments:  Maintain surgical dressing until follow up in the clinic. If the edges start to pull up, may reinforce with tape. If the dressing is no longer working, may remove and cover with gauze and tape, but must keep the area dry and clean.  Call with any questions or concerns.   03/27/17 2233       Signed: [place] P[place]. Leslye Puccini   PA-C  04/01/2017, 12:40 PM

## 2017-04-02 ENCOUNTER — Encounter (HOSPITAL_COMMUNITY): Payer: Self-pay | Admitting: Orthopedic Surgery

## 2017-04-15 NOTE — Progress Notes (Signed)
Need orders for 1-8- surgery in epic pre op is 1-3

## 2017-04-26 DIAGNOSIS — Z471 Aftercare following joint replacement surgery: Secondary | ICD-10-CM | POA: Diagnosis not present

## 2017-04-26 DIAGNOSIS — Z96641 Presence of right artificial hip joint: Secondary | ICD-10-CM | POA: Diagnosis not present

## 2017-04-28 NOTE — H&P (Signed)
TOTAL HIP ADMISSION H&P  Patient is admitted for left total hip arthroplasty, anterior approach.  Subjective:  Chief Complaint:  Left hip primary OA / pain  HPI: John Trujillo, 71 y.o. male, has a history of pain and functional disability in the left hip(s) due to arthritis and patient has failed non-surgical conservative treatments for greater than 12 weeks to include NSAID's and/or analgesics and activity modification.  Onset of symptoms was gradual starting >10 years ago with gradually worsening course since that time.The patient noted prior procedures of the hip to include arthroplasty on the right hip(s).  Patient currently rates pain in the left hip at 8 out of 10 with activity. Patient has night pain, worsening of pain with activity and weight bearing, trendelenberg gait, pain that interfers with activities of daily living and pain with passive range of motion. Patient has evidence of periarticular osteophytes and joint space narrowing by imaging studies. This condition presents safety issues increasing the risk of falls.  There is no current active infection.  Risks, benefits and expectations were discussed with the patient.  Risks including but not limited to the risk of anesthesia, blood clots, nerve damage, blood vessel damage, failure of the prosthesis, infection and up to and including death.  Patient understand the risks, benefits and expectations and wishes to proceed with surgery.   PCP: Laurey Morale, MD  D/C Plans:       Home   Post-op Meds:       No Rx given  Tranexamic Acid:      To be given - IV   Decadron:      Is to be given  FYI:     ASA  Norco  DME:   Pt already has equipment    PT:   No PT     Patient Active Problem List   Diagnosis Date Noted  . S/P right THA, AA 03/26/2017  . S/P hip replacement 03/26/2017  . Dyslipidemia 03/05/2017  . Erectile dysfunction 03/05/2017  . 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 Osteoarthritis 03/05/2017  . Right hip pain  03/05/2017  . HEMORRHOIDS, INTERNAL 09/06/2007  . DIVERTICULOSIS, COLON 09/06/2007  . 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 PROSTATITIS, HX OF 09/06/2007  . POLYPECTOMY, HX OF 09/06/2007   Past Medical History:  Diagnosis Date  . Arthritis    in knees, has seen Dr. Wynelle Link   . 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 History of hemorrhoids   . Prostatitis   . Pyelonephritis    history of urinary retention  . Tubulovillous adenoma of colon     Past Surgical History:  Procedure Laterality Date  . COLONOSCOPY  06-04-14   per Dr. Olevia Perches, adenomatous  polyps, repeat in 5 yrs   . history of Bezoar removed from back pre-malignant    . LAPAROTOMY     for intestinal obstruction as a child  . mini laparotomy for variceal repair    . POLYPECTOMY    . TONSILLECTOMY    . TOTAL HIP ARTHROPLASTY Right 03/26/2017   Procedure: RIGHT TOTAL HIP ARTHROPLASTY ANTERIOR APPROACH;  Surgeon: Paralee Cancel, MD;  Location: [facility];  Service: Orthopedics;  Laterality: Right;  General    No current facility-administered medications for this encounter.    Current Outpatient Medications  Medication Sig Dispense Refill Last Dose  . Diphenhydramine-APAP, sleep, (EXCEDRIN PM) 38-500 MG TABS Take 1 tablet by mouth at bedtime as needed (for  sleep).     . ferrous sulfate (FERROUSUL) 325 (65 FE) MG tablet Take 1 tablet (325 mg total) by mouth 3 (three) times daily with meals. (Patient taking differently: Take 325 mg by mouth daily with breakfast. )  3   . Omega-3 Fatty Acids (FISH OIL) 1000 MG CAPS Take 1,000 mg by mouth daily.    03/20/2017  . sildenafil (VIAGRA) 100 MG tablet Take 1 tablet (100 mg total) daily as needed by mouth for erectile dysfunction. (Patient taking differently: Take 50 mg daily as needed by mouth for erectile dysfunction. ) 10 tablet 11 Past Month at Unknown time  . docusate sodium (COLACE) 100 MG capsule Take 1 capsule (100 mg total) by  mouth 2 (two) times daily. (Patient not taking: Reported on 04/24/2017) 10 capsule 0 Not Taking at Unknown time  . HYDROcodone-acetaminophen (NORCO) 7.5-325 MG tablet Take 1-2 tablets by mouth every 4 (four) hours as needed for moderate pain or severe pain. (Patient not taking: Reported on 04/24/2017) 60 tablet 0 Completed Course at Unknown time  . methocarbamol (ROBAXIN) 500 MG tablet Take 1 tablet (500 mg total) by mouth every 6 (six) hours as needed for muscle spasms. (Patient not taking: Reported on 04/24/2017) 40 tablet 0 Completed Course at Unknown time  . polyethylene glycol (MIRALAX / GLYCOLAX) packet Take 17 g by mouth 2 (two) times daily. (Patient not taking: Reported on 04/24/2017) 14 each 0 Not Taking at Unknown time   No Known Allergies  Social History   Tobacco Use  . Smoking status: Never Smoker  . Smokeless tobacco: Never Used  Substance Use Topics  . Alcohol use: Yes    Alcohol/week: 8.4 oz    Types: 14 Standard drinks or equivalent per week    Comment: 2 glasses of wine each night    Family History  Problem Relation Age of Onset  . Cancer Father        Prostate cancer  . Colon cancer Neg Hx   . Rectal cancer Neg Hx   . Stomach cancer Neg Hx      Review of Systems  Constitutional: Negative.   HENT: Negative.   Eyes: Negative.   Respiratory: Negative.   Cardiovascular: Negative.   Gastrointestinal: Negative.   Genitourinary: Negative.   Musculoskeletal: Positive for joint pain.  Skin: Negative.   Neurological: Negative.   Endo/Heme/Allergies: Negative.   Psychiatric/Behavioral: Negative.     Objective:  Physical Exam  Constitutional: He is oriented to person, place, and time. He appears well-developed.  HENT:  Head: Normocephalic.  Eyes: Pupils are equal, round, and reactive to light.  Neck: Neck supple. No JVD present. No tracheal deviation present. No thyromegaly present.  Cardiovascular: Normal rate, regular rhythm and intact distal pulses.   Respiratory: Effort normal and breath sounds normal. No respiratory distress. He has no wheezes.  GI: Soft. There is no tenderness. There is no guarding.  Musculoskeletal:       Left hip: He exhibits decreased range of motion, decreased strength, tenderness and bony tenderness. He exhibits no swelling, no deformity and no laceration.  Lymphadenopathy:    He has no cervical adenopathy.  Neurological: He is alert and oriented to person, place, and time.  Skin: Skin is warm and dry.  Psychiatric: He has a normal mood and affect.     Labs:  Estimated body mass index is 24.55 kg/m as calculated from the following:   Height as of 03/26/17: 5\' 11"  (1.803 m).   Weight as of 03/26/17:  79.8 kg (176 lb).   Imaging Review Plain radiographs demonstrate severe degenerative joint disease of the left hip(s). The bone quality appears to be good for age and reported activity level.  Assessment/Plan:  End stage arthritis, left hip(s)  The patient history, physical examination, clinical judgement of the provider and imaging studies are consistent with end stage degenerative joint disease of the left hip(s) and total hip arthroplasty is deemed medically necessary. The treatment options including medical management, injection therapy, arthroscopy and arthroplasty were discussed at length. The risks and benefits of total hip arthroplasty were presented and reviewed. The risks due to aseptic loosening, infection, stiffness, dislocation/subluxation,  thromboembolic complications and other imponderables were discussed.  The patient acknowledged the explanation, agreed to proceed with the plan and consent was signed. Patient is being admitted for inpatient treatment for surgery, pain control, PT, OT, prophylactic antibiotics, VTE prophylaxis, progressive ambulation and ADL's and discharge planning.The patient is planning to be discharged home.      West Pugh Brandis Wixted   PA-C  04/28/2017, 6:24 PM

## 2017-05-01 ENCOUNTER — Encounter (HOSPITAL_COMMUNITY): Payer: Self-pay

## 2017-05-01 NOTE — Patient Instructions (Addendum)
Your procedure is scheduled on: Tuesday, Jan. 8, 2018   Surgery Time:  8:40AM-9:50AM   Report to [facility]  Entrance    Report to admitting at 6:10 AM    Call this number if you have problems the morning of surgery [phone]   Do not eat food or drink liquids :After Midnight.   Do NOT smoke after Midnight   Take these medicines the morning of surgery with A SIP OF WATER: None                               You may not have any metal on your body including jewelry, and body piercings              Do not wear lotions, powders, perfumes,or deodorant                           Men may shave face and neck.   Do not bring valuables to the hospital. [place].   Contacts, dentures or bridgework may not be worn into surgery.   Leave suitcase in the car. After surgery it may be brought to your room.                 Please read over the following fact sheets you were given:  [facility] - Preparing for Surgery Before surgery, you can play an important role.  Because skin is not sterile, your skin needs to be as free of germs as possible.  You can reduce the number of germs on your skin by washing with CHG (chlorahexidine gluconate) soap before surgery.  CHG is an antiseptic cleaner which kills germs and bonds with the skin to continue killing germs even after washing. Please DO NOT use if you have an allergy to CHG or antibacterial soaps.  If your skin becomes reddened/irritated stop using the CHG and inform your nurse when you arrive at Short Stay. Do not shave (including legs and underarms) for at least 48 hours prior to the first CHG shower.  You may shave your face/neck.  Please follow these instructions carefully:  1.  Shower with CHG Soap the night before surgery and the  morning of surgery.  2.  If you choose to wash your hair, wash your hair first as usual with your normal  shampoo.  3.  After you shampoo,  rinse your hair and body thoroughly to remove the shampoo.                             4.  Use CHG as you would any other liquid soap.  You can apply chg directly to the skin and wash.  Gently with a scrungie or clean washcloth.   5.  Apply the CHG Soap to your body ONLY FROM THE NECK DOWN.   Do not use on face/ open                           Wound or open sores. Avoid contact with eyes, ears mouth and genitals (private parts).  Wash face,  Genitals (private parts) with your normal soap.              6.  Wash thoroughly, paying special attention to the area where your surgery  will be performed.   7.  Thoroughly rinse your body with warm water from the neck down.  8.  DO NOT shower/wash with your normal soap after using and rinsing off the CHG Soap.                9.  Pat yourself dry with a clean towel.            10.  Wear clean pajamas.            11.  Place clean sheets on your bed the night of your first shower and do not  sleep with pets.  Day of Surgery : Do not apply any lotions/deodorants the morning of surgery.  Please wear clean clothes to the hospital/surgery center.  FAILURE TO FOLLOW THESE INSTRUCTIONS MAY RESULT IN THE CANCELLATION OF YOUR SURGERY  PATIENT SIGNATURE_________________________________  NURSE SIGNATURE__________________________________  ________________________________________________________________________   John Trujillo  An incentive spirometer is a tool that can help keep your lungs clear and active. This tool measures how well you are filling your lungs with each breath. Taking long deep breaths may help reverse or decrease the chance of developing breathing (pulmonary) problems (especially infection) following:  A long period of time when you are unable to move or be active. BEFORE THE PROCEDURE   If the spirometer includes an indicator to show your best effort, your nurse or respiratory therapist will set it to a  desired goal.  If possible, sit up straight or lean slightly forward. Try not to slouch.  Hold the incentive spirometer in an upright position. INSTRUCTIONS FOR USE  1. Sit on the edge of your bed if possible, or sit up as far as you can in bed or on a chair. 2. Hold the incentive spirometer in an upright position. 3. Breathe out normally. 4. Place the mouthpiece in your mouth and seal your lips tightly around it. 5. Breathe in slowly and as deeply as possible, raising the piston or the ball toward the top of the column. 6. Hold your breath for 3-5 seconds or for as long as possible. Allow the piston or ball to fall to the bottom of the column. 7. Remove the mouthpiece from your mouth and breathe out normally. 8. Rest for a few seconds and repeat Steps 1 through 7 at least 10 times every 1-2 hours when you are awake. Take your time and take a few normal breaths between deep breaths. 9. The spirometer may include an indicator to show your best effort. Use the indicator as a goal to work toward during each repetition. 10. After each set of 10 deep breaths, practice coughing to be sure your lungs are clear. If you have an incision (the cut made at the time of surgery), support your incision when coughing by placing a pillow or rolled up towels firmly against it. Once you are able to get out of bed, walk around indoors and cough well. You may stop using the incentive spirometer when instructed by your caregiver.  RISKS AND COMPLICATIONS  Take your time so you do not get dizzy or light-headed.  If you are in pain, you may need to take or ask for pain medication before doing incentive spirometry. It is harder to take a deep breath if you  are having pain. AFTER USE  Rest and breathe slowly and easily.  It can be helpful to keep track of a log of your progress. Your caregiver can provide you with a simple table to help with this. If you are using the spirometer at home, follow these  instructions: [place] IF:   You are having difficultly using the spirometer.  You have trouble using the spirometer as often as instructed.  Your pain medication is not giving enough relief while using the spirometer.  You develop fever of 100.5 F (38.1 C) or higher. SEEK IMMEDIATE MEDICAL CARE IF:   You cough up bloody sputum that had not been present before.  You develop fever of 102 F (38.9 C) or greater.  You develop worsening pain at or near the incision site. MAKE SURE YOU:   Understand these instructions.  Will watch your condition.  Will get help right away if you are not doing well or get worse. Document Released: 08/27/2006 Document Revised: 07/09/2011 Document Reviewed: 10/28/2006 Exit[employer] Patient Information 2014 Exit[employer], [place].   ________________________________________________________________________  WHAT IS A BLOOD TRANSFUSION? Blood Transfusion Information  A transfusion is the replacement of blood or some of its parts. Blood is made up of multiple cells which provide different functions.  Red blood cells carry oxygen and are used for blood loss replacement.  White blood cells fight against infection.  Platelets control bleeding.  Plasma helps clot blood.  Other blood products are available for specialized needs, such as hemophilia or other clotting disorders. BEFORE THE TRANSFUSION  Who gives blood for transfusions?   Healthy volunteers who are fully evaluated to make sure their blood is safe. This is blood bank blood. Transfusion therapy is the safest it has ever been in the practice of medicine. Before blood is taken from a donor, a complete history is taken to make sure that person has no history of diseases nor engages in risky social behavior (examples are intravenous drug use or sexual activity with multiple partners). The donor's travel history is screened to minimize risk of transmitting infections, such as malaria. The donated  blood is tested for signs of infectious diseases, such as HIV and hepatitis. The blood is then tested to be sure it is compatible with you in order to minimize the chance of a transfusion reaction. If you or a relative donates blood, this is often done in anticipation of surgery and is not appropriate for emergency situations. It takes many days to process the donated blood. RISKS AND COMPLICATIONS Although transfusion therapy is very safe and saves many lives, the main dangers of transfusion include:   Getting an infectious disease.  Developing a transfusion reaction. This is an allergic reaction to something in the blood you were given. Every precaution is taken to prevent this. The decision to have a blood transfusion has been considered carefully by your caregiver before blood is given. Blood is not given unless the benefits outweigh the risks. AFTER THE TRANSFUSION  Right after receiving a blood transfusion, you will usually feel much better and more energetic. This is especially true if your red blood cells have gotten low (anemic). The transfusion raises the level of the red blood cells which carry oxygen, and this usually causes an energy increase.  The nurse administering the transfusion will monitor you carefully for complications. HOME CARE INSTRUCTIONS  No special instructions are needed after a transfusion. You may find your energy is better. Speak with your caregiver about any limitations on activity  for underlying diseases you may have. SEEK MEDICAL CARE IF:   Your condition is not improving after your transfusion.  You develop redness or irritation at the intravenous (IV) site. SEEK IMMEDIATE MEDICAL CARE IF:  Any of the following symptoms occur over the next 12 hours:  Shaking chills.  You have a temperature by mouth above 102 F (38.9 C), not controlled by medicine.  Chest, back, or muscle pain.  People around you feel you are not acting correctly or are  confused.  Shortness of breath or difficulty breathing.  Dizziness and fainting.  You get a rash or develop hives.  You have a decrease in urine output.  Your urine turns a dark color or changes to pink, red, or brown. Any of the following symptoms occur over the next 10 days:  You have a temperature by mouth above 102 F (38.9 C), not controlled by medicine.  Shortness of breath.  Weakness after normal activity.  The white part of the eye turns yellow (jaundice).  You have a decrease in the amount of urine or are urinating less often.  Your urine turns a dark color or changes to pink, red, or brown. Document Released: 04/13/2000 Document Revised: 07/09/2011 Document Reviewed: 12/01/2007 [facility] Patient Information 2014 [place], [place].  _______________________________________________________________________

## 2017-05-01 NOTE — Pre-Procedure Instructions (Signed)
The following are in epic: EKG 116/18 Pelvis xray 03/26/17

## 2017-05-02 ENCOUNTER — Other Ambulatory Visit: Payer: Self-pay

## 2017-05-02 ENCOUNTER — Encounter (HOSPITAL_COMMUNITY): Payer: Self-pay

## 2017-05-02 ENCOUNTER — Encounter (HOSPITAL_COMMUNITY)
Admission: RE | Admit: 2017-05-02 | Discharge: 2017-05-02 | Disposition: A | Discharge status: Home / Self Care | Payer: Medicare Other | Source: Ambulatory Visit | Attending: Orthopedic Surgery | Admitting: Orthopedic Surgery

## 2017-05-02 DIAGNOSIS — Z01812 Encounter for preprocedural laboratory examination: Secondary | ICD-10-CM | POA: Diagnosis present

## 2017-05-02 DIAGNOSIS — M1612 Unilateral primary osteoarthritis, left hip: Secondary | ICD-10-CM | POA: Diagnosis not present

## 2017-05-02 DIAGNOSIS — Z0183 Encounter for blood typing: Secondary | ICD-10-CM | POA: Insufficient documentation

## 2017-05-02 HISTORY — DX: Induration penis plastica: N48.6

## 2017-05-02 HISTORY — DX: Male erectile dysfunction, unspecified: N52.9

## 2017-05-02 HISTORY — DX: Hyperlipidemia, unspecified: E78.5

## 2017-05-02 LAB — CBC
HCT: 42.1 % (ref 39.0–52.0)
Hemoglobin: 14.1 g/dL (ref 13.0–17.0)
MCH: 30.7 pg (ref 26.0–34.0)
MCHC: 33.5 g/dL (ref 30.0–36.0)
MCV: 91.5 fL (ref 78.0–100.0)
PLATELETS: 239 10*3/uL (ref 150–400)
RBC: 4.6 MIL/uL (ref 4.22–5.81)
RDW: 13.4 % (ref 11.5–15.5)
WBC: 5.1 10*3/uL (ref 4.0–10.5)

## 2017-05-02 LAB — SURGICAL PCR SCREEN
MRSA, PCR: NEGATIVE
Staphylococcus aureus: NEGATIVE

## 2017-05-06 ENCOUNTER — Encounter (HOSPITAL_COMMUNITY): Payer: Self-pay | Admitting: Certified Registered Nurse Anesthetist

## 2017-05-07 ENCOUNTER — Inpatient Hospital Stay (HOSPITAL_COMMUNITY): Payer: Medicare Other

## 2017-05-07 ENCOUNTER — Inpatient Hospital Stay (HOSPITAL_COMMUNITY): Payer: Medicare Other | Admitting: Anesthesiology

## 2017-05-07 ENCOUNTER — Encounter (HOSPITAL_COMMUNITY)
Admission: RE | Disposition: A | Discharge status: Home / Self Care | Payer: Self-pay | Source: Ambulatory Visit | Attending: Orthopedic Surgery

## 2017-05-07 ENCOUNTER — Inpatient Hospital Stay (HOSPITAL_COMMUNITY)
Admission: RE | Admit: 2017-05-07 | Discharge: 2017-05-08 | DRG: 470 | Disposition: A | Discharge status: Home / Self Care | Payer: Medicare Other | Source: Ambulatory Visit | Attending: Orthopedic Surgery | Admitting: Orthopedic Surgery

## 2017-05-07 ENCOUNTER — Encounter (HOSPITAL_COMMUNITY): Payer: Self-pay | Admitting: Certified Registered Nurse Anesthetist

## 2017-05-07 ENCOUNTER — Other Ambulatory Visit: Payer: Self-pay

## 2017-05-07 DIAGNOSIS — E785 Hyperlipidemia, unspecified: Secondary | ICD-10-CM | POA: Diagnosis present

## 2017-05-07 DIAGNOSIS — Z96642 Presence of left artificial hip joint: Secondary | ICD-10-CM | POA: Diagnosis not present

## 2017-05-07 DIAGNOSIS — Z471 Aftercare following joint replacement surgery: Secondary | ICD-10-CM | POA: Diagnosis not present

## 2017-05-07 DIAGNOSIS — Z96649 Presence of unspecified artificial hip joint: Secondary | ICD-10-CM

## 2017-05-07 DIAGNOSIS — Z79899 Other long term (current) drug therapy: Secondary | ICD-10-CM | POA: Diagnosis not present

## 2017-05-07 DIAGNOSIS — K648 Other hemorrhoids: Secondary | ICD-10-CM | POA: Diagnosis not present

## 2017-05-07 DIAGNOSIS — Z96641 Presence of right artificial hip joint: Secondary | ICD-10-CM | POA: Diagnosis present

## 2017-05-07 DIAGNOSIS — M1612 Unilateral primary osteoarthritis, left hip: Secondary | ICD-10-CM | POA: Diagnosis not present

## 2017-05-07 DIAGNOSIS — M25552 Pain in left hip: Secondary | ICD-10-CM

## 2017-05-07 DIAGNOSIS — I2510 Atherosclerotic heart disease of native coronary artery without angina pectoris: Secondary | ICD-10-CM | POA: Diagnosis not present

## 2017-05-07 HISTORY — PX: TOTAL HIP ARTHROPLASTY: SHX124

## 2017-05-07 LAB — TYPE AND SCREEN
ABO/RH(D): A NEG
Antibody Screen: NEGATIVE

## 2017-05-07 SURGERY — ARTHROPLASTY, HIP, TOTAL, ANTERIOR APPROACH
Anesthesia: General | Site: Hip | Laterality: Left

## 2017-05-07 MED ORDER — OXYCODONE HCL 5 MG/5ML PO SOLN: 5.0000 mg | Freq: Once | ORAL | Status: DC | PRN

## 2017-05-07 MED ORDER — SODIUM CHLORIDE 0.9 % IR SOLN
Status: DC | PRN
  Administered 2017-05-07: 1000 mL

## 2017-05-07 MED ORDER — OXYCODONE HCL 5 MG PO TABS: 5.0000 mg | ORAL_TABLET | Freq: Once | ORAL | Status: DC | PRN

## 2017-05-07 MED ORDER — PROPOFOL 10 MG/ML IV BOLUS
INTRAVENOUS | Status: AC
  Filled 2017-05-07: qty 40

## 2017-05-07 MED ORDER — FENTANYL CITRATE (PF) 100 MCG/2ML IJ SOLN
INTRAMUSCULAR | Status: AC
  Filled 2017-05-07: qty 2

## 2017-05-07 MED ORDER — LIDOCAINE 2% (20 MG/ML) 5 ML SYRINGE
INTRAMUSCULAR | Status: AC
  Filled 2017-05-07: qty 5

## 2017-05-07 MED ORDER — HYDROMORPHONE HCL 1 MG/ML IJ SOLN
0.2500 mg | INTRAMUSCULAR | Status: DC | PRN
  Administered 2017-05-07 (×4): 0.5 mg via INTRAVENOUS

## 2017-05-07 MED ORDER — ASPIRIN 81 MG PO CHEW: 81.0000 mg | CHEWABLE_TABLET | Freq: Two times a day (BID) | ORAL | 0 refills | Status: AC

## 2017-05-07 MED ORDER — ONDANSETRON HCL 4 MG/2ML IJ SOLN
INTRAMUSCULAR | Status: AC
  Filled 2017-05-07: qty 2

## 2017-05-07 MED ORDER — HYDROMORPHONE HCL 1 MG/ML IJ SOLN: 0.5000 mg | INTRAMUSCULAR | Status: DC | PRN

## 2017-05-07 MED ORDER — LACTATED RINGERS IV SOLN
INTRAVENOUS | Status: DC | PRN
  Administered 2017-05-07 (×2): via INTRAVENOUS

## 2017-05-07 MED ORDER — METHOCARBAMOL 500 MG PO TABS: 500.0000 mg | ORAL_TABLET | Freq: Four times a day (QID) | ORAL | 0 refills | Status: DC | PRN

## 2017-05-07 MED ORDER — BISACODYL 10 MG RE SUPP: 10.0000 mg | Freq: Every day | RECTAL | Status: DC | PRN

## 2017-05-07 MED ORDER — METOCLOPRAMIDE HCL 5 MG PO TABS: 5.0000 mg | ORAL_TABLET | Freq: Three times a day (TID) | ORAL | Status: DC | PRN

## 2017-05-07 MED ORDER — CELECOXIB 200 MG PO CAPS
200.0000 mg | ORAL_CAPSULE | Freq: Two times a day (BID) | ORAL | Status: DC
  Administered 2017-05-08: 200 mg via ORAL
  Filled 2017-05-07: qty 1

## 2017-05-07 MED ORDER — ONDANSETRON HCL 4 MG/2ML IJ SOLN
INTRAMUSCULAR | Status: DC | PRN
  Administered 2017-05-07: 4 mg via INTRAVENOUS

## 2017-05-07 MED ORDER — MENTHOL 3 MG MT LOZG: 1.0000 | LOZENGE | OROMUCOSAL | Status: DC | PRN

## 2017-05-07 MED ORDER — HYDROCODONE-ACETAMINOPHEN 7.5-325 MG PO TABS: 1.0000 | ORAL_TABLET | ORAL | 0 refills | Status: DC | PRN

## 2017-05-07 MED ORDER — PROMETHAZINE HCL 25 MG/ML IJ SOLN: 6.2500 mg | INTRAMUSCULAR | Status: DC | PRN

## 2017-05-07 MED ORDER — DEXAMETHASONE SODIUM PHOSPHATE 10 MG/ML IJ SOLN
10.0000 mg | Freq: Once | INTRAMUSCULAR | Status: AC
  Administered 2017-05-07: 10 mg via INTRAVENOUS

## 2017-05-07 MED ORDER — DOCUSATE SODIUM 100 MG PO CAPS: 100.0000 mg | ORAL_CAPSULE | Freq: Two times a day (BID) | ORAL | 0 refills | Status: DC

## 2017-05-07 MED ORDER — ACETAMINOPHEN 650 MG RE SUPP: 650.0000 mg | RECTAL | Status: DC | PRN

## 2017-05-07 MED ORDER — DOCUSATE SODIUM 100 MG PO CAPS
100.0000 mg | ORAL_CAPSULE | Freq: Two times a day (BID) | ORAL | Status: DC
  Administered 2017-05-07 – 2017-05-08 (×2): 100 mg via ORAL
  Filled 2017-05-07 (×2): qty 1

## 2017-05-07 MED ORDER — FENTANYL CITRATE (PF) 100 MCG/2ML IJ SOLN
INTRAMUSCULAR | Status: DC | PRN
  Administered 2017-05-07 (×6): 25 ug via INTRAVENOUS
  Administered 2017-05-07: 50 ug via INTRAVENOUS

## 2017-05-07 MED ORDER — ALUM & MAG HYDROXIDE-SIMETH 200-200-20 MG/5ML PO SUSP: 15.0000 mL | ORAL | Status: DC | PRN

## 2017-05-07 MED ORDER — CEFAZOLIN SODIUM-DEXTROSE 2-4 GM/100ML-% IV SOLN
2.0000 g | Freq: Four times a day (QID) | INTRAVENOUS | Status: AC
  Administered 2017-05-07 (×2): 2 g via INTRAVENOUS
  Filled 2017-05-07 (×2): qty 100

## 2017-05-07 MED ORDER — MAGNESIUM CITRATE PO SOLN: 1.0000 | Freq: Once | ORAL | Status: DC | PRN

## 2017-05-07 MED ORDER — FERROUS SULFATE 325 (65 FE) MG PO TABS
325.0000 mg | ORAL_TABLET | Freq: Three times a day (TID) | ORAL | Status: DC
  Administered 2017-05-08: 325 mg via ORAL
  Filled 2017-05-07: qty 1

## 2017-05-07 MED ORDER — ONDANSETRON HCL 4 MG PO TABS: 4.0000 mg | ORAL_TABLET | Freq: Four times a day (QID) | ORAL | Status: DC | PRN

## 2017-05-07 MED ORDER — POLYETHYLENE GLYCOL 3350 17 G PO PACK: 17.0000 g | PACK | Freq: Two times a day (BID) | ORAL | 0 refills | Status: DC

## 2017-05-07 MED ORDER — PHENOL 1.4 % MT LIQD: 1.0000 | OROMUCOSAL | Status: DC | PRN

## 2017-05-07 MED ORDER — STERILE WATER FOR IRRIGATION IR SOLN
Status: DC | PRN
  Administered 2017-05-07: 2000 mL

## 2017-05-07 MED ORDER — TRANEXAMIC ACID 1000 MG/10ML IV SOLN
1000.0000 mg | Freq: Once | INTRAVENOUS | Status: AC
  Administered 2017-05-07: 1000 mg via INTRAVENOUS
  Filled 2017-05-07: qty 1100

## 2017-05-07 MED ORDER — SODIUM CHLORIDE 0.9 % IV SOLN
INTRAVENOUS | Status: DC
  Administered 2017-05-07: 100 mL/h via INTRAVENOUS

## 2017-05-07 MED ORDER — MIDAZOLAM HCL 2 MG/2ML IJ SOLN
INTRAMUSCULAR | Status: AC
  Filled 2017-05-07: qty 2

## 2017-05-07 MED ORDER — DEXAMETHASONE SODIUM PHOSPHATE 10 MG/ML IJ SOLN
10.0000 mg | Freq: Once | INTRAMUSCULAR | Status: AC
  Administered 2017-05-08: 10 mg via INTRAVENOUS
  Filled 2017-05-07: qty 1

## 2017-05-07 MED ORDER — HYDROCODONE-ACETAMINOPHEN 7.5-325 MG PO TABS
2.0000 | ORAL_TABLET | ORAL | Status: DC | PRN
  Administered 2017-05-07 – 2017-05-08 (×3): 2 via ORAL
  Filled 2017-05-07 (×3): qty 2

## 2017-05-07 MED ORDER — POLYETHYLENE GLYCOL 3350 17 G PO PACK
17.0000 g | PACK | Freq: Two times a day (BID) | ORAL | Status: DC
  Administered 2017-05-07: 17 g via ORAL
  Filled 2017-05-07: qty 1

## 2017-05-07 MED ORDER — ONDANSETRON HCL 4 MG/2ML IJ SOLN: 4.0000 mg | Freq: Four times a day (QID) | INTRAMUSCULAR | Status: DC | PRN

## 2017-05-07 MED ORDER — ASPIRIN 81 MG PO CHEW
81.0000 mg | CHEWABLE_TABLET | Freq: Two times a day (BID) | ORAL | Status: DC
  Administered 2017-05-07 – 2017-05-08 (×2): 81 mg via ORAL
  Filled 2017-05-07 (×2): qty 1

## 2017-05-07 MED ORDER — CHLORHEXIDINE GLUCONATE 4 % EX LIQD: 60.0000 mL | Freq: Once | CUTANEOUS | Status: DC

## 2017-05-07 MED ORDER — MIDAZOLAM HCL 5 MG/5ML IJ SOLN
INTRAMUSCULAR | Status: DC | PRN
  Administered 2017-05-07: 2 mg via INTRAVENOUS

## 2017-05-07 MED ORDER — TRANEXAMIC ACID 1000 MG/10ML IV SOLN
1000.0000 mg | INTRAVENOUS | Status: AC
  Administered 2017-05-07: 1000 mg via INTRAVENOUS
  Filled 2017-05-07: qty 1100

## 2017-05-07 MED ORDER — FERROUS SULFATE 325 (65 FE) MG PO TABS: 325.0000 mg | ORAL_TABLET | Freq: Three times a day (TID) | ORAL | 3 refills | Status: DC

## 2017-05-07 MED ORDER — METOCLOPRAMIDE HCL 5 MG/ML IJ SOLN: 5.0000 mg | Freq: Three times a day (TID) | INTRAMUSCULAR | Status: DC | PRN

## 2017-05-07 MED ORDER — DEXAMETHASONE SODIUM PHOSPHATE 10 MG/ML IJ SOLN
INTRAMUSCULAR | Status: AC
  Filled 2017-05-07: qty 1

## 2017-05-07 MED ORDER — MEPERIDINE HCL 50 MG/ML IJ SOLN: 6.2500 mg | INTRAMUSCULAR | Status: DC | PRN

## 2017-05-07 MED ORDER — CEFAZOLIN SODIUM-DEXTROSE 2-4 GM/100ML-% IV SOLN
2.0000 g | INTRAVENOUS | Status: AC
  Administered 2017-05-07: 2 g via INTRAVENOUS
  Filled 2017-05-07: qty 100

## 2017-05-07 MED ORDER — DIPHENHYDRAMINE HCL 12.5 MG/5ML PO ELIX: 12.5000 mg | ORAL_SOLUTION | ORAL | Status: DC | PRN

## 2017-05-07 MED ORDER — METHOCARBAMOL 500 MG PO TABS
500.0000 mg | ORAL_TABLET | Freq: Four times a day (QID) | ORAL | Status: DC | PRN
  Administered 2017-05-08: 500 mg via ORAL
  Filled 2017-05-07: qty 1

## 2017-05-07 MED ORDER — METHOCARBAMOL 1000 MG/10ML IJ SOLN
500.0000 mg | Freq: Four times a day (QID) | INTRAVENOUS | Status: DC | PRN
  Administered 2017-05-07: 500 mg via INTRAVENOUS
  Filled 2017-05-07: qty 550

## 2017-05-07 MED ORDER — HYDROMORPHONE HCL 1 MG/ML IJ SOLN
INTRAMUSCULAR | Status: AC
  Filled 2017-05-07: qty 2

## 2017-05-07 MED ORDER — HYDROCODONE-ACETAMINOPHEN 7.5-325 MG PO TABS
1.0000 | ORAL_TABLET | ORAL | Status: DC | PRN
  Administered 2017-05-07 – 2017-05-08 (×5): 1 via ORAL
  Filled 2017-05-07 (×5): qty 1

## 2017-05-07 MED ORDER — ACETAMINOPHEN 325 MG PO TABS: 650.0000 mg | ORAL_TABLET | ORAL | Status: DC | PRN

## 2017-05-07 MED ORDER — LIDOCAINE 2% (20 MG/ML) 5 ML SYRINGE
INTRAMUSCULAR | Status: DC | PRN
  Administered 2017-05-07: 100 mg via INTRAVENOUS

## 2017-05-07 MED ORDER — PROPOFOL 10 MG/ML IV BOLUS
INTRAVENOUS | Status: DC | PRN
  Administered 2017-05-07: 200 mg via INTRAVENOUS

## 2017-05-07 SURGICAL SUPPLY — 40 items
ADH SKN CLS APL DERMABOND .7 (GAUZE/BANDAGES/DRESSINGS) ×1
BAG DECANTER FOR FLEXI CONT (MISCELLANEOUS) IMPLANT
BAG SPEC THK2 15X12 ZIP CLS (MISCELLANEOUS) ×1
BAG ZIPLOCK 12X15 (MISCELLANEOUS) ×1 IMPLANT
BLADE SAG 18X100X1.27 (BLADE) ×2 IMPLANT
CAPT HIP TOTAL 2 ×1 IMPLANT
CLOTH BEACON ORANGE TIMEOUT ST (SAFETY) ×2 IMPLANT
COVER PERINEAL POST (MISCELLANEOUS) ×2 IMPLANT
COVER SURGICAL LIGHT HANDLE (MISCELLANEOUS) ×2 IMPLANT
DERMABOND ADVANCED (GAUZE/BANDAGES/DRESSINGS) ×1
DERMABOND ADVANCED .7 DNX12 (GAUZE/BANDAGES/DRESSINGS) ×1 IMPLANT
DRAPE STERI IOBAN 125X83 (DRAPES) ×2 IMPLANT
DRAPE U-SHAPE 47X51 STRL (DRAPES) ×4 IMPLANT
DRESSING AQUACEL AG SP 3.5X10 (GAUZE/BANDAGES/DRESSINGS) ×1 IMPLANT
DRSG AQUACEL AG SP 3.5X10 (GAUZE/BANDAGES/DRESSINGS) ×2
DURAPREP 26ML APPLICATOR (WOUND CARE) ×2 IMPLANT
ELECT REM PT RETURN 15FT ADLT (MISCELLANEOUS) ×2 IMPLANT
FACESHIELD WRAPAROUND (MASK) ×2 IMPLANT
FACESHIELD WRAPAROUND OR TEAM (MASK) IMPLANT
GLOVE BIOGEL M STRL SZ7.5 (GLOVE) ×2 IMPLANT
GLOVE BIOGEL PI IND STRL 7.5 (GLOVE) ×1 IMPLANT
GLOVE BIOGEL PI IND STRL 8.5 (GLOVE) ×1 IMPLANT
GLOVE BIOGEL PI INDICATOR 7.5 (GLOVE) ×1
GLOVE BIOGEL PI INDICATOR 8.5 (GLOVE)
GLOVE ECLIPSE 8.0 STRL XLNG CF (GLOVE) IMPLANT
GLOVE ORTHO TXT STRL SZ7.5 (GLOVE) ×2 IMPLANT
GOWN STRL REUS W/TWL LRG LVL3 (GOWN DISPOSABLE) ×2 IMPLANT
GOWN STRL REUS W/TWL XL LVL3 (GOWN DISPOSABLE) ×2 IMPLANT
HOLDER FOLEY CATH W/STRAP (MISCELLANEOUS) ×2 IMPLANT
PACK ANTERIOR HIP CUSTOM (KITS) ×2 IMPLANT
SUT MNCRL AB 4-0 PS2 18 (SUTURE) ×2 IMPLANT
SUT STRATAFIX 0 PDS 27 VIOLET (SUTURE)
SUT STRATAFIX 1PDS 45CM VIOLET (SUTURE) ×1 IMPLANT
SUT VIC AB 1 CT1 36 (SUTURE) ×6 IMPLANT
SUT VIC AB 2-0 CT1 27 (SUTURE) ×4
SUT VIC AB 2-0 CT1 TAPERPNT 27 (SUTURE) ×2 IMPLANT
SUTURE STRATFX 0 PDS 27 VIOLET (SUTURE) ×1 IMPLANT
TRAY FOLEY W/METER SILVER 16FR (SET/KITS/TRAYS/PACK) ×1 IMPLANT
WATER STERILE IRR 1000ML POUR (IV SOLUTION) ×2 IMPLANT
YANKAUER SUCT BULB TIP 10FT TU (MISCELLANEOUS) ×2 IMPLANT

## 2017-05-07 NOTE — Transfer of Care (Signed)
Immediate Anesthesia Transfer of Care Note  Patient: John Trujillo  Procedure(s) Performed: LEFT TOTAL HIP ARTHROPLASTY ANTERIOR APPROACH (Left Hip)  Patient Location: PACU  Anesthesia Type:General  Level of Consciousness: awake, alert  and oriented  Airway & Oxygen Therapy: Patient Spontanous Breathing and Patient connected to face mask oxygen  Post-op Assessment: Report given to RN and Post -op Vital signs reviewed and stable  Post vital signs: Reviewed and stable  Last Vitals:  Vitals:   05/07/17 0630  BP: (!) 144/86  Pulse: 68  Resp: 16  Temp: 36.6 C  SpO2: 100%    Last Pain:  Vitals:   05/07/17 0630  TempSrc: Oral         Complications: No apparent anesthesia complications

## 2017-05-07 NOTE — Interval H&P Note (Signed)
History and Physical Interval Note:  05/07/2017 7:05 AM  John Trujillo  has presented today for surgery, with the diagnosis of Left hip osteoarthritis  The various methods of treatment have been discussed with the patient and family. After consideration of risks, benefits and other options for treatment, the patient has consented to  Procedure(s) with comments: LEFT TOTAL HIP ARTHROPLASTY ANTERIOR APPROACH (Left) - 70 mins as a surgical intervention .  The patient's history has been reviewed, patient examined, no change in status, stable for surgery.  I have reviewed the patient's chart and labs.  Questions were answered to the patient's satisfaction.     Mauri Pole

## 2017-05-07 NOTE — Anesthesia Preprocedure Evaluation (Signed)
Anesthesia Evaluation  Patient identified by MRN, date of birth, ID band Patient awake    Reviewed: Allergy & Precautions, NPO status , Patient's Chart, lab work & pertinent test results  Airway Mallampati: II  TM Distance: >3 FB Neck ROM: Full    Dental  (+) Teeth Intact, Dental Advisory Given, Caps   Pulmonary neg pulmonary ROS,    Pulmonary exam normal breath sounds clear to auscultation       Cardiovascular Exercise Tolerance: Good + CAD  negative cardio ROS Normal cardiovascular exam Rhythm:Regular Rate:Normal     Neuro/Psych negative neurological ROS  negative psych ROS   GI/Hepatic negative GI ROS, Neg liver ROS,   Endo/Other  negative endocrine ROS  Renal/GU negative Renal ROS     Musculoskeletal  (+) Arthritis , Osteoarthritis,    Abdominal   Peds  Hematology negative hematology ROS (+)   Anesthesia Other Findings Day of surgery medications reviewed with the patient.  Reproductive/Obstetrics                             Anesthesia Physical  Anesthesia Plan  ASA: III  Anesthesia Plan: General   Post-op Pain Management:    Induction: Intravenous  PONV Risk Score and Plan: 2 and Ondansetron and Midazolam  Airway Management Planned: LMA  Additional Equipment:   Intra-op Plan:   Post-operative Plan: Extubation in OR  Informed Consent: I have reviewed the patients History and Physical, chart, labs and discussed the procedure including the risks, benefits and alternatives for the proposed anesthesia with the patient or authorized representative who has indicated his/her understanding and acceptance.   Dental advisory given  Plan Discussed with: CRNA, Anesthesiologist and Surgeon  Anesthesia Plan Comments:         Anesthesia Quick Evaluation

## 2017-05-07 NOTE — Anesthesia Procedure Notes (Signed)
Procedure Name: LMA Insertion Date/Time: 05/07/2017 9:09 AM Performed by: Claudia Desanctis, CRNA Pre-anesthesia Checklist: Emergency Drugs available, Patient identified, Suction available and Patient being monitored Patient Re-evaluated:Patient Re-evaluated prior to induction Oxygen Delivery Method: Circle system utilized Preoxygenation: Pre-oxygenation with 100% oxygen Induction Type: IV induction Ventilation: Mask ventilation without difficulty LMA: LMA inserted LMA Size: 5.0 Number of attempts: 1 Placement Confirmation: positive ETCO2 and breath sounds checked- equal and bilateral Tube secured with: Tape Dental Injury: Teeth and Oropharynx as per pre-operative assessment

## 2017-05-07 NOTE — Op Note (Signed)
NAME:  WINFRED IIAMS                ACCOUNT NO.: [id number]      MEDICAL RECORD NO.: [id number]      FACILITY:  [facility]      PHYSICIAN:  Mauri Pole  DATE OF BIRTH:  April 14, 1946     DATE OF PROCEDURE:  05/07/2017                                 OPERATIVE REPORT         PREOPERATIVE DIAGNOSIS: Left  hip osteoarthritis.      POSTOPERATIVE DIAGNOSIS:  Left hip osteoarthritis.      PROCEDURE:  Left total hip replacement through an anterior approach   utilizing DePuy THR system, component size 83mm pinnacle cup, a size 36+4 neutral   Altrex liner, a size 6 Hi Tri Lock stem with a 36=1.5 delta ceramic   ball.      SURGEON:  Pietro Cassis. Alvan Dame, M.D.      ASSISTANT:  Nehemiah Massed, PA-C     ANESTHESIA:  General.      SPECIMENS:  None.      COMPLICATIONS:  None.      BLOOD LOSS:  200 cc     DRAINS:  None.      INDICATION OF THE PROCEDURE:  RAJVIR ERNSTER is a 72 y.o. male who had   presented to office for evaluation of left hip pain.  Radiographs revealed   progressive degenerative changes with bone-on-bone   articulation to the  hip joint.  The patient had painful limited range of   motion significantly affecting their overall quality of life.  The patient was failing to    respond to conservative measures, and at this point was ready   to proceed with more definitive measures.  The patient has noted progressive   degenerative changes in his hip, progressive problems and dysfunction   with regarding the hip prior to surgery.  Consent was obtained for   benefit of pain relief.  Specific risk of infection, DVT, component   failure, dislocation, need for revision surgery, as well discussion of   the anterior versus posterior approach were reviewed.  Consent was   obtained for benefit of anterior pain relief through an anterior   approach.      PROCEDURE IN DETAIL:  The patient was brought to operative theater.   Once adequate anesthesia,  preoperative antibiotics, 2gm of Ancef, 1 gm of Tranexamic Acid, and 10 mg of Decadron administered.   The patient was positioned supine on the OSI Hanna table.  Once adequate   padding of boney process was carried out, we had predraped out the hip, and  used fluoroscopy to confirm orientation of the pelvis and position.      The left hip was then prepped and draped from proximal iliac crest to   mid thigh with shower curtain technique.      Time-out was performed identifying the patient, planned procedure, and   extremity.     An incision was then made 2 cm distal and lateral to the   anterior superior iliac spine extending over the orientation of the   tensor fascia lata muscle and sharp dissection was carried down to the   fascia of the muscle and protractor placed in the soft tissues.      The fascia  was then incised.  The muscle belly was identified and swept   laterally and retractor placed along the superior neck.  Following   cauterization of the circumflex vessels and removing some pericapsular   fat, a second cobra retractor was placed on the inferior neck.  A third   retractor was placed on the anterior acetabulum after elevating the   anterior rectus.  A L-capsulotomy was along the line of the   superior neck to the trochanteric fossa, then extended proximally and   distally.  Tag sutures were placed and the retractors were then placed   intracapsular.  We then identified the trochanteric fossa and   orientation of my neck cut, confirmed this radiographically   and then made a neck osteotomy with the femur on traction.  The femoral   head was removed without difficulty or complication.  Traction was let   off and retractors were placed posterior and anterior around the   acetabulum.      The labrum and foveal tissue were debrided.  Osteophytes around the acetabulum were debrided.  I began reaming with a 18mm   reamer and reamed up to 8mm reamer with good bony bed  preparation and a 58mm   cup was chosen matching the other hip.  The final 105mm Pinnacle cup was then impacted under fluoroscopy  to confirm the depth of penetration and orientation with respect to   abduction.  A screw was placed followed by the hole eliminator.  The final   36+4 neutral Altrex liner was impacted with good visualized rim fit.  The cup was positioned anatomically within the acetabular portion of the pelvis.      At this point, the femur was rolled at 80 degrees.  Further capsule was   released off the inferior aspect of the femoral neck.  I then   released the superior capsule proximally.  The hook was placed laterally   along the femur and elevated manually and held in position with the bed   hook.  The leg was then extended and adducted with the leg rolled to 100   degrees of external rotation.  Once the proximal femur was fully   exposed, I used a box osteotome to set orientation.  I then began   broaching with the starting chili pepper broach and passed this by hand and then broached up to the 6 broach matching other side.  With the 6 broach in place I chose a high offset neck and did several trial reductions.  The offset was appropriate, leg lengths   appeared to be equal best matched to the other hip with the +1.5 head ball confirmed radiographically.   Given these findings, I went ahead and dislocated the hip, repositioned all   retractors and positioned the right hip in the extended and abducted position.  The final 6 Hi Tri Lock stem was   chosen and it was impacted down to the level of neck cut.  Based on this   and the trial reduction, a 36+1.5 delta ceramic ball was chosen and   impacted onto a clean and dry trunnion, and the hip was reduced.  The   hip had been irrigated throughout the case again at this point.  I did   reapproximate the superior capsular leaflet to the anterior leaflet   using #1 Vicryl.  The fascia of the   tensor fascia lata muscle was then  reapproximated using #1 Vicryl and #! Stratafix sutures.  The  remaining wound was closed with 2-0 Vicryl and running 4-0 Monocryl.   The hip was cleaned, dried, and dressed sterilely using Dermabond and   Aquacel dressing.  He was then brought   to recovery room in stable condition tolerating the procedure well.    Nehemiah Massed, PA-C was present for the entirety of the case involved from   preoperative positioning, perioperative retractor management, general   facilitation of the case, as well as primary wound closure as assistant.            Pietro Cassis Alvan Dame, M.D.        05/07/2017 10:30 AM

## 2017-05-07 NOTE — Anesthesia Postprocedure Evaluation (Signed)
Anesthesia Post Note  Patient: John Trujillo  Procedure(s) Performed: LEFT TOTAL HIP ARTHROPLASTY ANTERIOR APPROACH (Left Hip)     Patient location during evaluation: PACU Anesthesia Type: General Level of consciousness: awake and alert Pain management: pain level controlled Vital Signs Assessment: post-procedure vital signs reviewed and stable Respiratory status: spontaneous breathing, nonlabored ventilation and respiratory function stable Cardiovascular status: blood pressure returned to baseline and stable Postop Assessment: no apparent nausea or vomiting Anesthetic complications: no    Last Vitals:  Vitals:   05/07/17 1200 05/07/17 1215  BP: 130/90 (!) 142/90  Pulse: 68 68  Resp: (!) 7 10  Temp:    SpO2: 98% 98%    Last Pain:  Vitals:   05/07/17 1200  TempSrc:   PainSc: [place]

## 2017-05-07 NOTE — Evaluation (Signed)
Physical Therapy Evaluation Patient Details Name: John Trujillo MRN: [id number] DOB: 1945-07-27 Today's Date: 05/07/2017   History of Present Illness  72 yo male s/p L THA-directct anterior 05/07/17. R THA 11/18.   Clinical Impression  On eval POD 0, pt was Min guard-Min assist for mobility. He walked ~70 feet with a RW. Pain rated 5/10 with activity. Will follow and progress activity as tolerated. Plan is for possible d/c home on tomorrow.     Follow Up Recommendations DC plan and follow up therapy as arranged by surgeon(No f/u)    Equipment Recommendations  None recommended by PT    Recommendations for Other Services       Precautions / Restrictions Precautions Precautions: Fall Restrictions Weight Bearing Restrictions: No LLE Weight Bearing: Weight bearing as tolerated      Mobility  Bed Mobility Overal bed mobility: Needs Assistance Bed Mobility: Supine to Sit     Supine to sit: Min assist;HOB elevated     General bed mobility comments: small amount of assist for L LE initially. Increased time.   Transfers Overall transfer level: Needs assistance Equipment used: Rolling walker (2 wheeled) Transfers: Sit to/from Stand Sit to Stand: Min guard;From elevated surface         General transfer comment: Close guard for safety. VCs safety, hand placement.   Ambulation/Gait Ambulation/Gait assistance: Min guard Ambulation Distance (Feet): 70 Feet Assistive device: Rolling walker (2 wheeled) Gait Pattern/deviations: Step-through pattern;Decreased step length - left;Decreased stride length;Narrow base of support     General Gait Details: Close guard for safety. cues for safety.   Stairs            Wheelchair Mobility    Modified Rankin (Stroke Patients Only)       Balance                                             Pertinent Vitals/Pain Pain Assessment: 0-10 Pain Score: 5  Pain Location: L hip/thigh Pain Descriptors /  Indicators: Sore Pain Intervention(s): Monitored during session;Repositioned;Ice applied    Home Living Family/patient expects to be discharged to:: Private residence Living Arrangements: Alone Available Help at Discharge: Family Type of Home: House Home Access: Stairs to enter Entrance Stairs-Rails: None Entrance Stairs-Number of Steps: 3 Home Layout: Able to live on main level with bedroom/bathroom Home Equipment: Environmental consultant - 2 wheels      Prior Function Level of Independence: Independent               Hand Dominance        Extremity/Trunk Assessment   Upper Extremity Assessment Upper Extremity Assessment: Overall WFL for tasks assessed    Lower Extremity Assessment Lower Extremity Assessment: Generalized weakness(s/p L THA)    Cervical / Trunk Assessment Cervical / Trunk Assessment: Normal  Communication   Communication: No difficulties  Cognition Arousal/Alertness: Awake/alert Behavior During Therapy: WFL for tasks assessed/performed Overall Cognitive Status: Within Functional Limits for tasks assessed                                        General Comments      Exercises     Assessment/Plan    PT Assessment Patient needs continued PT services  PT Problem List Decreased strength;Decreased range of motion;Decreased activity tolerance;Decreased  mobility;Pain;Decreased balance       PT Treatment Interventions DME instruction;Gait training;Functional mobility training;Therapeutic activities;Patient/family education;Balance training;Therapeutic exercise    PT Goals (Current goals can be found in the Care Plan section)  Acute Rehab PT Goals Patient Stated Goal: regain independence PT Goal Formulation: With patient Time For Goal Achievement: 05/21/17 Potential to Achieve Goals: Good    Frequency 7X/week   Barriers to discharge        Co-evaluation               AM-PAC PT "6 Clicks" Daily Activity  Outcome Measure  Difficulty turning over in bed (including adjusting bedclothes, sheets and blankets)?: A Lot Difficulty moving from lying on back to sitting on the side of the bed? : A Lot Difficulty sitting down on and standing up from a chair with arms (e.g., wheelchair, bedside commode, etc,.)?: A Lot Help needed moving to and from a bed to chair (including a wheelchair)?: A Little Help needed walking in hospital room?: A Little Help needed climbing 3-5 steps with a railing? : A Little 6 Click Score: 15    End of Session Equipment Utilized During Treatment: Gait belt Activity Tolerance: Patient tolerated treatment well Patient left: in chair;with call bell/phone within reach;with family/visitor present   PT Visit Diagnosis: Difficulty in walking, not elsewhere classified (R26.2);Pain Pain - Right/Left: Left Pain - part of body: Hip    Time: 1856-3149 PT Time Calculation (min) (ACUTE ONLY): 14 min   Charges:   PT Evaluation $PT Eval Low Complexity: 1 Low     PT G Codes:          Weston Anna, MPT Pager: [phone]

## 2017-05-08 LAB — BASIC METABOLIC PANEL
ANION GAP: 6 (ref 5–15)
BUN: 12 mg/dL (ref 6–20)
CHLORIDE: 103 mmol/L (ref 101–111)
CO2: 28 mmol/L (ref 22–32)
Calcium: 8.9 mg/dL (ref 8.9–10.3)
Creatinine, Ser: 0.88 mg/dL (ref 0.61–1.24)
GFR calc non Af Amer: >60 mL/min (ref >60)
Glucose, Bld: 110 mg/dL — ABNORMAL HIGH (ref 65–99)
Potassium: 4.3 mmol/L (ref 3.5–5.1)
SODIUM: 137 mmol/L (ref 135–145)

## 2017-05-08 LAB — CBC
HEMATOCRIT: 36.6 % — AB (ref 39.0–52.0)
HEMOGLOBIN: 12.1 g/dL — AB (ref 13.0–17.0)
MCH: 30.8 pg (ref 26.0–34.0)
MCHC: 33.1 g/dL (ref 30.0–36.0)
MCV: 93.1 fL (ref 78.0–100.0)
Platelets: 247 10*3/uL (ref 150–400)
RBC: 3.93 MIL/uL — ABNORMAL LOW (ref 4.22–5.81)
RDW: 13.7 % (ref 11.5–15.5)
WBC: 8.7 10*3/uL (ref 4.0–10.5)

## 2017-05-08 NOTE — Progress Notes (Signed)
     Subjective: 1 Day Post-Op Procedure(s) (LRB): LEFT TOTAL HIP ARTHROPLASTY ANTERIOR APPROACH (Left)   Patient reports pain as mild, pain controlled.  No events throughout the night. Feels that this less pain than he had after the previous hip surgery. Ready to be discharged home.   Objective:   VITALS:   Vitals:   05/08/17 0101 05/08/17 0455  BP: 122/71 122/75  Pulse: 69 69  Resp: 14 13  Temp: 97.9 F (36.6 C) 97.6 F (36.4 C)  SpO2: 95% 96%    Dorsiflexion/Plantar flexion intact Incision: dressing C/D/I No cellulitis present Compartment soft  LABS Recent Labs    05/08/17 0531  HGB 12.1*  HCT 36.6*  WBC 8.7  PLT 247    Recent Labs    05/08/17 0531  NA 137  K 4.3  BUN 12  CREATININE 0.88  GLUCOSE 110*     Assessment/Plan: 1 Day Post-Op Procedure(s) (LRB): LEFT TOTAL HIP ARTHROPLASTY ANTERIOR APPROACH (Left) Foley cath d/c'ed Advance diet Up with therapy D/C IV fluids Discharge home Follow up in 2 weeks at [facility]. Follow up with OLIN,Astin Rape D in 2 weeks.  Contact information:  [facility] [address], [address] [place] Alonnie Bieker   PAC  05/08/2017, 8:53 AM

## 2017-05-08 NOTE — Progress Notes (Signed)
Physical Therapy Treatment Patient Details Name: John Trujillo MRN: [id number] DOB: [birth year] Today's Date: 05/08/2017    History of Present Illness 72 yo male s/p L THA-direct anterior 05/07/17. R THA 11/18.     PT Comments    Pt continues to perform well. Reviewed exercises, gait training, and stair negotiation. Pt is familiar with exercises from having other hip surgery a few months ago. All education completed. Ready to d/c from PT standpoint-RN aware.     Follow Up Recommendations  DC plan and follow up therapy as arranged by surgeon;No PT follow up     Equipment Recommendations  None recommended by PT    Recommendations for Other Services       Precautions / Restrictions Precautions Precautions: Fall Restrictions Weight Bearing Restrictions: No LLE Weight Bearing: Weight bearing as tolerated    Mobility  Bed Mobility               General bed mobility comments: oob in recliner  Transfers Overall transfer level: Needs assistance Equipment used: Rolling walker (2 wheeled) Transfers: Sit to/from Stand Sit to Stand: Supervision         General transfer comment: for safety.   Ambulation/Gait Ambulation/Gait assistance: Supervision Ambulation Distance (Feet): 200 Feet Assistive device: Rolling walker (2 wheeled) Gait Pattern/deviations: Step-through pattern;Decreased step length - left;Decreased stride length     General Gait Details: for safety.    Stairs Stairs: Yes Min Assist Stair Management: Backwards;With walker Number of Stairs: 2 General stair comments: Assist to stabilize walker only. VCs safety, technique, sequence. Pt was familiar with this technique from before and he preferred to use it again. Also discussed using cane for stair negotiation  Wheelchair Mobility    Modified Rankin (Stroke Patients Only)       Balance                                            Cognition Arousal/Alertness:  Awake/alert Behavior During Therapy: WFL for tasks assessed/performed Overall Cognitive Status: Within Functional Limits for tasks assessed                                        Exercises Total Joint Exercises Hip ABduction/ADduction: AROM;Left;10 reps;Standing Long Arc Quad: AROM;Left;10 reps;Seated Knee Flexion: AROM;Left;10 reps;Standing Marching in Standing: AROM;Left;10 reps;Standing Standing Hip Extension: AROM;Left;10 reps;Standing General Exercises - Lower Extremity Heel Raises: AROM;Both;10 reps;Standing    General Comments        Pertinent Vitals/Pain Pain Assessment: 0-10 Pain Score: 2  Pain Location: L hip/thigh Pain Descriptors / Indicators: Sore Pain Intervention(s): Monitored during session;Repositioned    Home Living                      Prior Function            PT Goals (current goals can now be found in the care plan section) Progress towards PT goals: Progressing toward goals    Frequency    7X/week      PT Plan Current plan remains appropriate    Co-evaluation              AM-PAC PT "6 Clicks" Daily Activity  Outcome Measure  Difficulty turning over in bed (including adjusting bedclothes, sheets and blankets)?: None Difficulty moving  from lying on back to sitting on the side of the bed? : None Difficulty sitting down on and standing up from a chair with arms (e.g., wheelchair, bedside commode, etc,.)?: None Help needed moving to and from a bed to chair (including a wheelchair)?: None Help needed walking in hospital room?: A Little Help needed climbing 3-5 steps with a railing? : A Little 6 Click Score: 22    End of Session   Activity Tolerance: Patient tolerated treatment well Patient left: in chair;with call bell/phone within reach;with family/visitor present   PT Visit Diagnosis: Difficulty in walking, not elsewhere classified (R26.2);Pain Pain - Right/Left: Left Pain - part of body: Hip      Time: 8185-6314 PT Time Calculation (min) (ACUTE ONLY): 15 min  Charges:  $Gait Training: 8-22 mins                    G Codes:          Weston Anna, MPT Pager: [phone]

## 2017-05-08 NOTE — Discharge Instructions (Signed)

## 2017-05-08 NOTE — Discharge Summary (Signed)
Physician Discharge Summary  Patient ID: John Trujillo MRN: [id number] DOB/AGE: 1946/02/27 72 y.o.  Admit date: 05/07/2017 Discharge date:  05/08/2017  Procedures:  Procedure(s) (LRB): LEFT TOTAL HIP ARTHROPLASTY ANTERIOR APPROACH (Left)  Attending Physician:  Dr. Paralee Cancel   Admission Diagnoses:   Left hip primary OA / pain  Discharge Diagnoses:  Principal Problem:   S/P left THA, AA  Past Medical History:  Diagnosis Date  . Arthritis    in knees, has seen Dr. Wynelle Link   . Coronary artery, anomalous origin 1995   anomalous artery off the LAD by cath pt. denies  . Dyslipidemia   . ED (erectile dysfunction)   . History of hemorrhoids   . Peyronie's disease   . Prostatitis   . Pyelonephritis    history of urinary retention  . Tubulovillous adenoma of colon     HPI:    John Trujillo, 72 y.o. male, has a history of pain and functional disability in the left hip(s) due to arthritis and patient has failed non-surgical conservative treatments for greater than 12 weeks to include NSAID's and/or analgesics and activity modification.  Onset of symptoms was gradual starting >10 years ago with gradually worsening course since that time.The patient noted prior procedures of the hip to include arthroplasty on the right hip(s).  Patient currently rates pain in the left hip at 8 out of 10 with activity. Patient has night pain, worsening of pain with activity and weight bearing, trendelenberg gait, pain that interfers with activities of daily living and pain with passive range of motion. Patient has evidence of periarticular osteophytes and joint space narrowing by imaging studies. This condition presents safety issues increasing the risk of falls. There is no current active infection.  Risks, benefits and expectations were discussed with the patient.  Risks including but not limited to the risk of anesthesia, blood clots, nerve damage, blood vessel damage, failure of the prosthesis,  infection and up to and including death.  Patient understand the risks, benefits and expectations and wishes to proceed with surgery.   PCP: John Morale, MD   Discharged Condition: good  Hospital Course:  Patient underwent the above stated procedure on 05/07/2017. Patient tolerated the procedure well and brought to the recovery room in good condition and subsequently to the floor.  POD #1 BP: 122/75 ; Pulse: 69 ; Temp: 97.6 F (36.4 C) ; Resp: 13 Patient reports pain as mild, pain controlled.  No events throughout the night. Feels that this less pain than he had after the previous hip surgery. Ready to be discharged home.  Dorsiflexion/plantar flexion intact, incision: dressing C/D/I, no cellulitis present and compartment soft.   LABS  Basename    HGB     12.1  HCT     36.6    Discharge Exam: General appearance: alert, cooperative and no distress Extremities: Homans sign is negative, no sign of DVT, no edema, redness or tenderness in the calves or thighs and no ulcers, gangrene or trophic changes  Disposition: Home with follow up in 2 weeks   Follow-up Information    Paralee Cancel, MD. Schedule an appointment as soon as possible for a visit in 2 week(s).   Specialty:  Orthopedic Surgery Contact information: [address] [place] [address] [phone]           Discharge Instructions    Call MD / Call 911   Complete by:  As directed    If you experience chest  pain or shortness of breath, CALL 911 and be transported to the hospital emergency room.  If you develope a fever above 101 F, pus (white drainage) or increased drainage or redness at the wound, or calf pain, call your surgeon's office.   Change dressing   Complete by:  As directed    Maintain surgical dressing until follow up in the clinic. If the edges start to pull up, may reinforce with tape. If the dressing is no longer working, may remove and cover with gauze and tape, but must keep the  area dry and clean.  Call with any questions or concerns.   Constipation Prevention   Complete by:  As directed    Drink plenty of fluids.  Prune juice may be helpful.  You may use a stool softener, such as Colace (over the counter) 100 mg twice a day.  Use MiraLax (over the counter) for constipation as needed.   Diet - low sodium heart healthy   Complete by:  As directed    Discharge instructions   Complete by:  As directed    Maintain surgical dressing until follow up in the clinic. If the edges start to pull up, may reinforce with tape. If the dressing is no longer working, may remove and cover with gauze and tape, but must keep the area dry and clean.  Follow up in 2 weeks at [facility]. Call with any questions or concerns.   Increase activity slowly as tolerated   Complete by:  As directed    Weight bearing as tolerated with assist device (walker, cane, etc) as directed, use it as long as suggested by your surgeon or therapist, typically at least 4-6 weeks.   TED hose   Complete by:  As directed    Use stockings (TED hose) for 2 weeks on both leg(s).  You may remove them at night for sleeping.      Allergies as of 05/08/2017   No Known Allergies     Medication List    STOP taking these medications   EXCEDRIN PM 38-500 MG Tabs Generic drug:  Diphenhydramine-APAP (sleep)     TAKE these medications   aspirin 81 MG chewable tablet Commonly known as:  ASPIRIN CHILDRENS Chew 1 tablet (81 mg total) by mouth 2 (two) times daily.   docusate sodium 100 MG capsule Commonly known as:  COLACE Take 1 capsule (100 mg total) by mouth 2 (two) times daily.   ferrous sulfate 325 (65 FE) MG tablet Commonly known as:  FERROUSUL Take 1 tablet (325 mg total) by mouth 3 (three) times daily with meals. What changed:  when to take this   Fish Oil 1000 MG Caps Take 1,000 mg by mouth daily.   HYDROcodone-acetaminophen 7.5-325 MG tablet Commonly known as:  NORCO Take 1-2 tablets by  mouth every 4 (four) hours as needed for moderate pain or severe pain.   methocarbamol 500 MG tablet Commonly known as:  ROBAXIN Take 1 tablet (500 mg total) by mouth every 6 (six) hours as needed for muscle spasms.   polyethylene glycol packet Commonly known as:  MIRALAX / GLYCOLAX Take 17 g by mouth 2 (two) times daily.   sildenafil 100 MG tablet Commonly known as:  VIAGRA Take 1 tablet (100 mg total) daily as needed by mouth for erectile dysfunction. What changed:  how much to take            Discharge Care Instructions  (From admission, onward)  Start     Ordered   05/08/17 0000  Change dressing    Comments:  Maintain surgical dressing until follow up in the clinic. If the edges start to pull up, may reinforce with tape. If the dressing is no longer working, may remove and cover with gauze and tape, but must keep the area dry and clean.  Call with any questions or concerns.   05/08/17 0857       Signed: West Pugh. Lionell Matuszak   PA-C  05/08/2017, 9:17 AM

## 2017-05-17 MED FILL — HYDROCODON-APAP 7.5-325: 7.5-325 | 7 days supply | Qty: 42 | Fill #0

## 2017-06-19 DIAGNOSIS — Z471 Aftercare following joint replacement surgery: Secondary | ICD-10-CM | POA: Diagnosis not present

## 2017-06-19 DIAGNOSIS — Z96642 Presence of left artificial hip joint: Secondary | ICD-10-CM | POA: Diagnosis not present

## 2018-01-21 DIAGNOSIS — H5213 Myopia, bilateral: Secondary | ICD-10-CM | POA: Diagnosis not present

## 2018-03-20 ENCOUNTER — Encounter: Payer: Self-pay | Admitting: Internal Medicine

## 2018-03-20 ENCOUNTER — Ambulatory Visit: Payer: Medicare Other | Admitting: Internal Medicine

## 2018-03-20 VITALS — BP 120/86 | HR 61 | Temp 97.9°F | Wt 183.1 lb

## 2018-03-20 DIAGNOSIS — J329 Chronic sinusitis, unspecified: Secondary | ICD-10-CM | POA: Diagnosis not present

## 2018-03-20 DIAGNOSIS — R0982 Postnasal drip: Secondary | ICD-10-CM | POA: Diagnosis not present

## 2018-03-20 DIAGNOSIS — J069 Acute upper respiratory infection, unspecified: Secondary | ICD-10-CM

## 2018-03-20 MED ORDER — HYDROCODONE-HOMATROPINE 5-1.5 MG/5ML PO SYRP: 5.0000 mL | ORAL_SOLUTION | Freq: Three times a day (TID) | ORAL | 0 refills | Status: DC | PRN

## 2018-03-20 MED ORDER — DOXYCYCLINE HYCLATE 100 MG PO TABS: 100.0000 mg | ORAL_TABLET | Freq: Two times a day (BID) | ORAL | 0 refills | Status: AC

## 2018-03-20 NOTE — Patient Instructions (Signed)
This acts like a  Recurrent chronic  sinusitis    Saline nose spays to loosen mucous.   Begin EVERY DAY   Nasal cortisone  spray  Flonase or  Nasacort OTC   (2 sprays  eache nostril  )  If  Infected loosing drainage not clearing over the next week can add antibiotic   Cough med for comfort

## 2018-03-20 NOTE — Progress Notes (Signed)
Chief Complaint  Patient presents with  . Cough    PND x 2 weeks with Cough and sneezing. Green mucous. denies fever. No OTC tx. Pt states this usually ends up turning into Bronchitis every year.     HPI: John Trujillo 72 y.o. come in for  SDA  PCP NA   Cough and phlegm  For 2 weeks   Seems  Seasonal.  Nocturnal cough   Wife advised he get checked .  No fever  Face congestion  and nasal drainage   Now beginning to cough a lot  No fever   Hemoptysis . Grandkid.  exposure  Cough  otc meds no help Ask for help with cough medicine  No hx lung disease  Tobacco  [place]  [place] from [place]  No dx allergy ROS: See pertinent positives and negatives per HPI. No sob  No wheezing    Past Medical History:  Diagnosis Date  . Arthritis    in knees, has seen Dr. Wynelle Link   . Coronary artery, anomalous origin 1995   anomalous artery off the LAD by cath pt. denies  . Dyslipidemia   . ED (erectile dysfunction)   . History of hemorrhoids   . Peyronie's disease   . Prostatitis   . Pyelonephritis    history of urinary retention  . Tubulovillous adenoma of colon     Family History  Problem Relation Age of Onset  . Cancer Father        Prostate cancer  . Colon cancer Neg Hx   . Rectal cancer Neg Hx   . Stomach cancer Neg Hx     Social History   Socioeconomic History  . Marital status: Married    Spouse name: Not on file  . Number of children: 2  . Years of education: 88  . Highest education level: Not on file  Occupational History  . Occupation: executive    Comment: retired  Scientific laboratory technician  . Financial resource strain: Not on file  . Food insecurity:    Worry: Not on file    Inability: Not on file  . Transportation needs:    Medical: Not on file    Non-medical: Not on file  Tobacco Use  . Smoking status: Never Smoker  . Smokeless tobacco: Never Used  Substance and Sexual Activity  . Alcohol use: Yes    Alcohol/week: 14.0 standard drinks    Types: 14 Standard drinks or  equivalent per week    Comment: 2 glasses of wine each night  . Drug use: No  . Sexual activity: Yes    Partners: Female  Lifestyle  . Physical activity:    Days per week: Not on file    Minutes per session: Not on file  . Stress: Not on file  Relationships  . Social connections:    Talks on phone: Not on file    Gets together: Not on file    Attends religious service: Not on file    Active member of club or organization: Not on file    Attends meetings of clubs or organizations: Not on file    Relationship status: Not on file  Other Topics Concern  . Not on file  Social History Narrative   [place],  [place] and [employer]. Married-'76. 1 son- '83 (lives at home); 1 Daughter- '80-married, PhD. Work - Retired Programme researcher, broadcasting/film/video from [place] '06 enjoys retirement and exercise.Wife -  Had aortic valve and root replacement '13, suffered MI  post-op, had 45 min open heart massage followed by CABG. She has to have two stents placed by Liam Rogers '14. She is making a good recovery. marriage is in good health.             Outpatient Medications Prior to Visit  Medication Sig Dispense Refill  . glucosamine-chondroitin 500-400 MG tablet Take 1 tablet by mouth 3 (three) times daily.    . Omega-3 Fatty Acids (FISH OIL) 1000 MG CAPS Take 1,000 mg by mouth daily.     . sildenafil (VIAGRA) 100 MG tablet Take 1 tablet (100 mg total) daily as needed by mouth for erectile dysfunction. (Patient taking differently: Take 50 mg daily as needed by mouth for erectile dysfunction. ) 10 tablet 11  . vitamin E 1000 UNIT capsule Take 1,000 Units by mouth daily.    Marland Kitchen docusate sodium (COLACE) 100 MG capsule Take 1 capsule (100 mg total) by mouth 2 (two) times daily. (Patient not taking: Reported on 03/20/2018) 10 capsule 0  . ferrous sulfate (FERROUSUL) 325 (65 FE) MG tablet Take 1 tablet (325 mg total) by mouth 3 (three) times daily with meals. (Patient not taking: Reported on 03/20/2018)  3  .  HYDROcodone-acetaminophen (NORCO) 7.5-325 MG tablet Take 1-2 tablets by mouth every 4 (four) hours as needed for moderate pain or severe pain. (Patient not taking: Reported on 03/20/2018) 60 tablet 0  . methocarbamol (ROBAXIN) 500 MG tablet Take 1 tablet (500 mg total) by mouth every 6 (six) hours as needed for muscle spasms. (Patient not taking: Reported on 03/20/2018) 40 tablet 0  . polyethylene glycol (MIRALAX / GLYCOLAX) packet Take 17 g by mouth 2 (two) times daily. (Patient not taking: Reported on 03/20/2018) 14 each 0   No facility-administered medications prior to visit.      EXAM:  BP 120/86 (BP Location: Left Arm, Patient Position: Sitting, Cuff Size: Normal)   Pulse 61   Temp 97.9 F (36.6 C) (Oral)   Wt 183 lb 1.6 oz (83.1 kg)   SpO2 99%   BMI 25.54 kg/m   Body mass index is 25.54 kg/m. WDWN in NAD  quiet respirations; mildly congested  somewhat hoarse. Non toxic . HEENT: Normocephalic ;atraumatic , Eyes;  PERRL, EOMs  Full, lids and conjunctiva clear,,Ears: no deformities, canals nl, TM landmarks normal, Nose: no deformity or discharge but congested;face non  tender Mouth : OP clear without lesion or edema . Cobblestoning  Neck: Supple without adenopathy or masses or bruits Chest:  Clear to A&P without wheezes rales or rhonchi CV:  S1-S2 no gallops or murmurs peripheral perfusion is normal Skin :nl perfusion and no acute rashes   G  BP Readings from Last 3 Encounters:  03/20/18 120/86  05/08/17 122/75  05/02/17 133/84    ASSESSMENT AND PLAN:  Discussed the following assessment and plan:  Sinusitis, unspecified chronicity, unspecified location  Protracted URI  Post-nasal drainage Recurrent  Sinus uri etc   Disc poss under;ying  Allergy   But at this time appears to be infection  Saline and  INCS  If not improving can add antibiotic    Chest exam is normal today  Caution with cough med -Patient advised to return or notify health care team  if  new concerns  arise.  Patient Instructions  This acts like a  Recurrent chronic  sinusitis    Saline nose spays to loosen mucous.   Begin EVERY DAY   Nasal cortisone  spray  Flonase or  Nasacort  OTC   (2 sprays  eache nostril  )  If  Infected loosing drainage not clearing over the next week can add antibiotic   Cough med for comfort     Mariann Laster K.  M.D.

## 2019-01-28 ENCOUNTER — Other Ambulatory Visit: Payer: Self-pay

## 2019-01-28 DIAGNOSIS — Z20822 Contact with and (suspected) exposure to covid-19: Secondary | ICD-10-CM

## 2019-01-28 DIAGNOSIS — R6889 Other general symptoms and signs: Secondary | ICD-10-CM | POA: Diagnosis not present

## 2019-01-29 LAB — NOVEL CORONAVIRUS, NAA: SARS-CoV-2, NAA: NOT DETECTED

## 2019-02-13 ENCOUNTER — Other Ambulatory Visit: Payer: Self-pay

## 2019-02-13 DIAGNOSIS — Z20822 Contact with and (suspected) exposure to covid-19: Secondary | ICD-10-CM

## 2019-02-13 DIAGNOSIS — Z20828 Contact with and (suspected) exposure to other viral communicable diseases: Secondary | ICD-10-CM | POA: Diagnosis not present

## 2019-02-15 LAB — NOVEL CORONAVIRUS, NAA: SARS-CoV-2, NAA: NOT DETECTED

## 2019-04-17 ENCOUNTER — Encounter: Payer: Self-pay | Admitting: Family Medicine

## 2019-04-17 ENCOUNTER — Ambulatory Visit (INDEPENDENT_AMBULATORY_CARE_PROVIDER_SITE_OTHER): Payer: Medicare Other

## 2019-04-17 VITALS — Ht 71.0 in | Wt 181.0 lb

## 2019-04-17 DIAGNOSIS — Z0000 Encounter for general adult medical examination without abnormal findings: Secondary | ICD-10-CM | POA: Diagnosis not present

## 2019-04-17 NOTE — Patient Instructions (Addendum)
Mr. John Trujillo , Thank you for taking time to participate in your Medicare Wellness Visit. I appreciate your ongoing commitment to your health goals. Please review the following plan we discussed and let me know if I can assist you in the future.   Screening recommendations/referrals: Colorectal Screening: Performed 06/04/2014; due again 06/06/2019  Vision and Dental Exams: Recommended annual ophthalmology exams for early detection of glaucoma and other disorders of the eye. Patient states he sees eye provider annually. Recommended annual dental exams for proper oral hygiene. Patient states he sees dentist several times a year.  Diabetic Exams: Diabetic Eye Exam: N/A Diabetic Foot Exam: N/A  Vaccinations: Influenza vaccine: completed 01/15/2019; due Fall 2021. Pneumococcal vaccine: completed 01/14/2009 & 08/03/2015 Tdap vaccine: completed 01/14/2009; due now; patient to have at next office visit.  Shingles vaccine: completed 12/06/2017 & 05/02/2018. Up to date. Advanced directives: Advance directives discussed with you today. Please bring a copy of your POA (Power of [place]) and/or Living Will to your next appointment.  Goals: Recommend to drink at least 6-8 8oz glasses of water per day. Recommend to exercise for at least 150 minutes per week. Recommend to remove any items from the home that may cause slips or trips. Recommend to decrease portion sizes by eating 3 small healthy meals and at least 2 healthy snacks per day.  Next appointment: Please schedule your Annual Wellness Visit with your Nurse Health Advisor in one year.  Preventive Care 30 Years and Older, Male Preventive care refers to lifestyle choices and visits with your health care provider that can promote health and wellness. What does preventive care include?  A yearly physical exam. This is also called an annual well check.  Dental exams once or twice a year.  Routine eye exams. Ask your health care provider how  often you should have your eyes checked.  Personal lifestyle choices, including:  Daily care of your teeth and gums.  Regular physical activity.  Eating a healthy diet.  Avoiding tobacco and drug use.  Limiting alcohol use.  Practicing safe sex.  Taking low doses of aspirin every day if recommended by your health care provider..  Taking vitamin and mineral supplements as recommended by your health care provider. What happens during an annual well check? The services and screenings done by your health care provider during your annual well check will depend on your age, overall health, lifestyle risk factors, and family history of disease. Counseling  Your health care provider may ask you questions about your:  Alcohol use.  Tobacco use.  Drug use.  Emotional well-being.  Home and relationship well-being.  Sexual activity.  Eating habits.  History of falls.  Memory and ability to understand (cognition).  Work and work Statistician. Screening  You may have the following tests or measurements:  Height, weight, and BMI.  Blood pressure.  Lipid and cholesterol levels. These may be checked every 5 years, or more frequently if you are over 51 years old.  Skin check.  Lung cancer screening. You may have this screening every year starting at age 27 if you have a 30-pack-year history of smoking and currently smoke or have quit within the past 15 years.  Fecal occult blood test (FOBT) of the stool. You may have this test every year starting at age 5.  Flexible sigmoidoscopy or colonoscopy. You may have a sigmoidoscopy every 5 years or a colonoscopy every 10 years starting at age 46.  Prostate cancer screening. Recommendations will vary depending on your family  history and other risks.  Hepatitis C blood test.  Hepatitis B blood test.  Sexually transmitted disease (STD) testing.  Diabetes screening. This is done by checking your blood sugar (glucose) after you  have not eaten for a while (fasting). You may have this done every 1-3 years.  Abdominal aortic aneurysm (AAA) screening. You may need this if you are a current or former smoker.  Osteoporosis. You may be screened starting at age 19 if you are at high risk. Talk with your health care provider about your test results, treatment options, and if necessary, the need for more tests. Vaccines  Your health care provider may recommend certain vaccines, such as:  Influenza vaccine. This is recommended every year.  Tetanus, diphtheria, and acellular pertussis (Tdap, Td) vaccine. You may need a Td booster every 10 years.  Zoster vaccine. You may need this after age 47.  Pneumococcal 13-valent conjugate (PCV13) vaccine. One dose is recommended after age 82.  Pneumococcal polysaccharide (PPSV23) vaccine. One dose is recommended after age 63. Talk to your health care provider about which screenings and vaccines you need and how often you need them. This information is not intended to replace advice given to you by your health care provider. Make sure you discuss any questions you have with your health care provider. Document Released: 05/13/2015 Document Revised: 01/04/2016 Document Reviewed: 02/15/2015 Elsevier Interactive Patient Education  2017 [employer] Prevention in the Home Falls can cause injuries. They can happen to people of all ages. There are many things you can do to make your home safe and to help prevent falls. What can I do on the outside of my home?  Regularly fix the edges of walkways and driveways and fix any cracks.  Remove anything that might make you trip as you walk through a door, such as a raised step or threshold.  Trim any bushes or trees on the path to your home.  Use bright outdoor lighting.  Clear any walking paths of anything that might make someone trip, such as rocks or tools.  Regularly check to see if handrails are loose or broken. Make sure that  both sides of any steps have handrails.  Any raised decks and porches should have guardrails on the edges.  Have any leaves, snow, or ice cleared regularly.  Use sand or salt on walking paths during winter.  Clean up any spills in your garage right away. This includes oil or grease spills. What can I do in the bathroom?  Use night lights.  Install grab bars by the toilet and in the tub and shower. Do not use towel bars as grab bars.  Use non-skid mats or decals in the tub or shower.  If you need to sit down in the shower, use a plastic, non-slip stool.  Keep the floor dry. Clean up any water that spills on the floor as soon as it happens.  Remove soap buildup in the tub or shower regularly.  Attach bath mats securely with double-sided non-slip rug tape.  Do not have throw rugs and other things on the floor that can make you trip. What can I do in the bedroom?  Use night lights.  Make sure that you have a light by your bed that is easy to reach.  Do not use any sheets or blankets that are too big for your bed. They should not hang down onto the floor.  Have a firm chair that has side arms. You can use  this for support while you get dressed.  Do not have throw rugs and other things on the floor that can make you trip. What can I do in the kitchen?  Clean up any spills right away.  Avoid walking on wet floors.  Keep items that you use a lot in easy-to-reach places.  If you need to reach something above you, use a strong step stool that has a grab bar.  Keep electrical cords out of the way.  Do not use floor polish or wax that makes floors slippery. If you must use wax, use non-skid floor wax.  Do not have throw rugs and other things on the floor that can make you trip. What can I do with my stairs?  Do not leave any items on the stairs.  Make sure that there are handrails on both sides of the stairs and use them. Fix handrails that are broken or loose. Make sure  that handrails are as long as the stairways.  Check any carpeting to make sure that it is firmly attached to the stairs. Fix any carpet that is loose or worn.  Avoid having throw rugs at the top or bottom of the stairs. If you do have throw rugs, attach them to the floor with carpet tape.  Make sure that you have a light switch at the top of the stairs and the bottom of the stairs. If you do not have them, ask someone to add them for you. What else can I do to help prevent falls?  Wear shoes that:  Do not have high heels.  Have rubber bottoms.  Are comfortable and fit you well.  Are closed at the toe. Do not wear sandals.  If you use a stepladder:  Make sure that it is fully opened. Do not climb a closed stepladder.  Make sure that both sides of the stepladder are locked into place.  Ask someone to hold it for you, if possible.  Clearly mark and make sure that you can see:  Any grab bars or handrails.  First and last steps.  Where the edge of each step is.  Use tools that help you move around (mobility aids) if they are needed. These include:  Canes.  Walkers.  Scooters.  Crutches.  Turn on the lights when you go into a dark area. Replace any light bulbs as soon as they burn out.  Set up your furniture so you have a clear path. Avoid moving your furniture around.  If any of your floors are uneven, fix them.  If there are any pets around you, be aware of where they are.  Review your medicines with your doctor. Some medicines can make you feel dizzy. This can increase your chance of falling. Ask your doctor what other things that you can do to help prevent falls. This information is not intended to replace advice given to you by your health care provider. Make sure you discuss any questions you have with your health care provider. Document Released: 02/10/2009 Document Revised: 09/22/2015 Document Reviewed: 05/21/2014 Elsevier Interactive Patient Education   2017 [employer].

## 2019-04-17 NOTE — Telephone Encounter (Signed)
Pt called and advised that appt was virtually.

## 2019-04-17 NOTE — Progress Notes (Signed)
This visit is being conducted via phone call due to the COVID-19 pandemic. This patient has given me verbal consent via phone to conduct this visit, patient states they are participating from their home address. Some vital signs may be absent or patient reported.   Patient identification: identified by name, DOB, and current address.  Location provider: [facility], Office Persons participating in the virtual visit: Mr. Mirac Mctiernan and Franne Forts, LPN.   Subjective:   John Trujillo is a 73 y.o. male who presents for an Initial Medicare Annual Wellness Visit.  He reports he is continuing to play golf 1-2 times a week and walk 2-3 miles several times a week when the weather permits. He has started doing yoga with his wife as well. He states he feels better when he weighs around 172 lbs and knows he can exercise more and can make better food choices. We also discussed him taking a MVI because he does not like to eat vegetables.  Review of Systems  Cardiac Risk Factors include: advanced age (>35men, >70 women);male gender;sedentary lifestyle   Objective:    Today's Vitals   04/17/19 0911  Weight: 181 lb (82.1 kg)  Height: 5\' 11"  (1.803 m)   Body mass index is 25.24 kg/m.  Advanced Directives 04/17/2019 05/07/2017 05/02/2017 03/26/2017 03/20/2017 05/20/2014  Does Patient Have a Medical Advance Directive? Yes Yes Yes Yes Yes Yes  Type of Paramedic of [place];Living will Living will Living will Living will;Healthcare Power of Attorney Living will;Healthcare Power of Day  Does patient want to make changes to medical advance directive? No - Patient declined No - Patient declined No - Patient declined No - Patient declined No - Patient declined -  Copy of [place] in Chart? No - copy requested - - No - copy requested - -    Current Medications (verified) Outpatient Encounter Medications as of 04/17/2019   Medication Sig  . glucosamine-chondroitin 500-400 MG tablet Take 1 tablet by mouth 3 (three) times daily.  . Omega-3 Fatty Acids (FISH OIL) 1000 MG CAPS Take 1,000 mg by mouth daily.   . sildenafil (VIAGRA) 100 MG tablet Take 1 tablet (100 mg total) daily as needed by mouth for erectile dysfunction. (Patient taking differently: Take 50 mg daily as needed by mouth for erectile dysfunction. )  . vitamin E 1000 UNIT capsule Take 1,000 Units by mouth daily.  . ferrous sulfate (FERROUSUL) 325 (65 FE) MG tablet Take 1 tablet (325 mg total) by mouth 3 (three) times daily with meals. (Patient not taking: Reported on 03/20/2018)  . HYDROcodone-homatropine (HYCODAN) 5-1.5 MG/5ML syrup Take 5 mLs by mouth every 8 (eight) hours as needed for cough. (Patient not taking: Reported on 04/17/2019)  . [DISCONTINUED] docusate sodium (COLACE) 100 MG capsule Take 1 capsule (100 mg total) by mouth 2 (two) times daily. (Patient not taking: Reported on 03/20/2018)   No facility-administered encounter medications on file as of 04/17/2019.    Allergies (verified) Patient has no known allergies.   History: Past Medical History:  Diagnosis Date  . Arthritis    in knees, has seen Dr. Wynelle Link   . Coronary artery, anomalous origin 1995   anomalous artery off the LAD by cath pt. denies  . Dyslipidemia   . ED (erectile dysfunction)   . History of hemorrhoids   . Peyronie's disease   . Prostatitis   . Pyelonephritis    history of urinary retention  . Tubulovillous  adenoma of colon    Past Surgical History:  Procedure Laterality Date  . COLONOSCOPY  06-04-14   per Dr. Olevia Perches, adenomatous  polyps, repeat in 5 yrs   . history of Bezoar removed from back pre-malignant    . LAPAROTOMY     for intestinal obstruction as a child  . POLYPECTOMY    . TONSILLECTOMY    . 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 TOTAL HIP ARTHROPLASTY Left 05/07/2017   Procedure: LEFT TOTAL HIP ARTHROPLASTY ANTERIOR APPROACH;  Surgeon: Paralee Cancel, MD;  Location: [facility];  Service: Orthopedics;  Laterality: Left;  70 mins   Family History  Problem Relation Age of Onset  . Cancer Father        Prostate cancer  . Colon cancer Neg Hx   . Rectal cancer Neg Hx   . Stomach cancer Neg Hx    Social History   Socioeconomic History  . Marital status: Married    Spouse name: Not on file  . Number of children: 2  . Years of education: 84  . Highest education level: Not on file  Occupational History  . Occupation: executive    Comment: retired  Tobacco Use  . Smoking status: Never Smoker  . Smokeless tobacco: Never Used  Substance and Sexual Activity  . Alcohol use: Yes    Alcohol/week: 14.0 standard drinks    Types: 14 Standard drinks or equivalent per week    Comment: 2 glasses of wine each night  . Drug use: No  . Sexual activity: Yes    Partners: Female  Other Topics Concern  . Not on file  Social History Narrative   [place] and [employer]. Married-'76. 1 son- '83 (lives at home); 1 Daughter- '80-married, PhD. Work - Retired Programme researcher, broadcasting/film/video from [place] '06 enjoys retirement and exercise.Wife -  Had aortic valve and root replacement '13, suffered MI post-op, had 45 min open heart massage followed by CABG. She has to have two stents placed by Liam Rogers '14. She is making a good recovery. marriage is in good health.            Social Determinants of Health   Financial Resource Strain: Low Risk   . Difficulty of Paying Living Expenses: Not hard at all  Food Insecurity: No Food Insecurity  . Worried About Charity fundraiser in the Last Year: Never true  . Ran Out of Food in the Last Year: Never true  Transportation Needs:   . Lack of Transportation (Medical): Not on file  . Lack of Transportation (Non-Medical): Not on file  Physical  Activity:   . Days of Exercise per Week: Not on file  . Minutes of Exercise per Session: Not on file  Stress:   . Feeling of Stress : Not on file  Social Connections:   . Frequency of Communication with Friends and Family: Not on file  . Frequency of Social Gatherings with Friends and Family: Not on file  . Attends Religious Services: Not on file  . Active Member of Clubs or Organizations: Not on file  . Attends Archivist Meetings: Not on file  . Marital Status: Not on file   Tobacco Counseling Counseling given: Not Answered   Clinical Intake:  Pre-visit preparation completed: Yes  Pain : No/denies pain  Nutritional Status: BMI 25 -29 Overweight Nutritional Risks: None Diabetes: No  How often do you need to have someone help you when you read instructions, pamphlets, or other written materials from your doctor or pharmacy?: 1 - Never What is the last grade level you completed in school?: Masters Degree  Interpreter Needed?: No  Information entered by :: Franne Forts, LPN.  Activities of Daily Living In your present state of health, do you have any difficulty performing the following activities: 04/17/2019  Hearing? N  Vision? N  Difficulty concentrating or making decisions? N  Walking or climbing stairs? N  Dressing or bathing? N  Doing errands, shopping? N  Preparing Food and eating ? N  Using the Toilet? N  In the past six months, have you accidently leaked urine? N  Do you have problems with loss of bowel control? N  Managing your Medications? N  Managing your Finances? N  Housekeeping or managing your Housekeeping? N  Some recent data might be hidden     Immunizations and Health Maintenance Immunization History  Administered Date(s) Administered  . Fluad Quad(high Dose 65+) 01/15/2019  . Influenza, High Dose Seasonal PF 03/17/2013, 02/03/2018  . Influenza-Unspecified 03/31/2015, 02/23/2016, 02/05/2017  . Pneumococcal Conjugate-13  08/03/2015  . Pneumococcal Polysaccharide-23 01/14/2009  . Td 01/14/2009  . Zoster Recombinat (Shingrix) 12/06/2017, 05/02/2018   Health Maintenance Due  Topic Date Due  . PNA vac Low Risk Adult (2 of 2 - PPSV23) 08/02/2016  . TETANUS/TDAP  01/15/2019    Patient Care Team: Laurey Morale, MD as PCP - General (Family Medicine)  Indicate any recent Medical Services you may have received from other than [facility] providers in the past year (date may be approximate).    Assessment:   This is a routine wellness examination for Bria.  Hearing/Vision screen  Hearing Screening   125Hz  250Hz  500Hz  1000Hz  2000Hz  3000Hz  4000Hz  6000Hz  8000Hz   Right ear:           Left ear:           Comments: Patient denies problems with hearing   Vision Screening Comments: Patient wears glasses and has eye exams annually  Dietary issues and exercise activities discussed: Current Exercise Habits: Home exercise routine, Type of exercise: walking;yoga;Other - see comments(golf), Time (Minutes): 60, Frequency (Times/Week): 6, Weekly Exercise (Minutes/Week): 360, Intensity: Mild, Exercise limited by: None identified  Goals    . Increase physical activity     Specifically increase intensity    . Weight (lb) < 172 lb (78 kg)      Depression Screen PHQ 2/9 Scores 04/17/2019 07/10/2016 07/11/2015 03/30/2014  PHQ - 2 Score 0 0 0 0    Fall Risk Fall Risk  04/17/2019 07/10/2016 07/11/2015 03/30/2014  Falls in the past year? 0 No No No    Is the patient's home free of loose throw rugs in walkways, pet beds, electrical cords, etc?   yes      Grab bars in the bathroom? yes      Handrails on the stairs?   yes      Adequate lighting?   yes  Timed Get Up and Go performed: N/A due to telephone visit  Cognitive Function:     6CIT Screen 04/17/2019  What Year? 0 points  What month? 0 points  What time? 0 points  Count back from 20 0 points  Months in reverse 0 points  Repeat phrase 0 points  Total Score 0     Screening Tests  Health Maintenance  Topic Date Due  . PNA vac Low Risk Adult (2 of 2 - PPSV23) 08/02/2016  . TETANUS/TDAP  01/15/2019  . COLONOSCOPY  06/05/2019  . INFLUENZA VACCINE  Completed  . Hepatitis C Screening  Completed    Qualifies for Shingles Vaccine? Patient has already completed Shingrix   Cancer Screenings: Lung: Low Dose CT Chest recommended if Age 5-80 years, 30 pack-year currently smoking OR have quit w/in 15years. Patient does not qualify. Colorectal: Yes; due again 06/06/2019.   Additional Screenings:  Hepatitis C Screening: completed 08/03/2015.      Plan:   Mr. Morein was scheduled for a cpe with Dr. Sarajane Jews in January 2021 and will get an updated Tdap at that time. He will have another colonoscopy in Feb 2021 also.   I have personally reviewed and noted the following in the patient's chart:   . Medical and social history . Use of alcohol, tobacco or illicit drugs  . Current medications and supplements . Functional ability and status . Nutritional status . Physical activity . Advanced directives . List of other physicians . Hospitalizations, surgeries, and ER visits in previous 12 months . Vitals . Screenings to include cognitive, depression, and falls . Referrals and appointments  In addition, I have reviewed and discussed with patient certain preventive protocols, quality metrics, and best practice recommendations. A written personalized care plan for preventive services as well as general preventive health recommendations were provided to patient.     Franne Forts, LPN   [phone]

## 2019-05-14 ENCOUNTER — Encounter: Payer: Self-pay | Admitting: Family Medicine

## 2019-05-14 ENCOUNTER — Ambulatory Visit (INDEPENDENT_AMBULATORY_CARE_PROVIDER_SITE_OTHER): Payer: Medicare Other | Admitting: Family Medicine

## 2019-05-14 ENCOUNTER — Other Ambulatory Visit: Payer: Self-pay

## 2019-05-14 VITALS — BP 120/64 | HR 95 | Temp 97.1°F | Wt 189.2 lb

## 2019-05-14 DIAGNOSIS — N401 Enlarged prostate with lower urinary tract symptoms: Secondary | ICD-10-CM | POA: Diagnosis not present

## 2019-05-14 DIAGNOSIS — M8949 Other hypertrophic osteoarthropathy, multiple sites: Secondary | ICD-10-CM

## 2019-05-14 DIAGNOSIS — N529 Male erectile dysfunction, unspecified: Secondary | ICD-10-CM

## 2019-05-14 DIAGNOSIS — Z0000 Encounter for general adult medical examination without abnormal findings: Secondary | ICD-10-CM

## 2019-05-14 DIAGNOSIS — E785 Hyperlipidemia, unspecified: Secondary | ICD-10-CM | POA: Diagnosis not present

## 2019-05-14 DIAGNOSIS — M159 Polyosteoarthritis, unspecified: Secondary | ICD-10-CM

## 2019-05-14 DIAGNOSIS — N138 Other obstructive and reflux uropathy: Secondary | ICD-10-CM | POA: Diagnosis not present

## 2019-05-14 LAB — PSA: PSA: 1.91 ng/mL (ref 0.10–4.00)

## 2019-05-14 LAB — TSH: TSH: 2.91 u[IU]/mL (ref 0.35–4.50)

## 2019-05-14 LAB — CBC WITH DIFFERENTIAL/PLATELET
Basophils Absolute: 0.1 10*3/uL (ref 0.0–0.1)
Basophils Relative: 2.3 % (ref 0.0–3.0)
Eosinophils Absolute: 0.2 10*3/uL (ref 0.0–0.7)
Eosinophils Relative: 3.5 % (ref 0.0–5.0)
HCT: 46.8 % (ref 39.0–52.0)
Hemoglobin: 15.9 g/dL (ref 13.0–17.0)
Lymphocytes Relative: 30.1 % (ref 12.0–46.0)
Lymphs Abs: 1.4 10*3/uL (ref 0.7–4.0)
MCHC: 33.9 g/dL (ref 30.0–36.0)
MCV: 94.1 fl (ref 78.0–100.0)
Monocytes Absolute: 0.5 10*3/uL (ref 0.1–1.0)
Monocytes Relative: 10.8 % (ref 3.0–12.0)
Neutro Abs: 2.6 10*3/uL (ref 1.4–7.7)
Neutrophils Relative %: 53.3 % (ref 43.0–77.0)
Platelets: 217 10*3/uL (ref 150.0–400.0)
RBC: 4.97 Mil/uL (ref 4.22–5.81)
RDW: 13.8 % (ref 11.5–15.5)
WBC: 4.8 10*3/uL (ref 4.0–10.5)

## 2019-05-14 LAB — BASIC METABOLIC PANEL
BUN: 14 mg/dL (ref 6–23)
CO2: 30 mEq/L (ref 19–32)
Calcium: 9.8 mg/dL (ref 8.4–10.5)
Chloride: 101 mEq/L (ref 96–112)
Creatinine, Ser: 1.07 mg/dL (ref 0.40–1.50)
GFR: 67.87 mL/min (ref >60.00)
Glucose, Bld: 87 mg/dL (ref 70–99)
Potassium: 4.6 mEq/L (ref 3.5–5.1)
Sodium: 137 mEq/L (ref 135–145)

## 2019-05-14 LAB — HEPATIC FUNCTION PANEL
ALT: 26 U/L (ref 0–53)
AST: 21 U/L (ref 0–37)
Albumin: 4.5 g/dL (ref 3.5–5.2)
Alkaline Phosphatase: 62 U/L (ref 39–117)
Bilirubin, Direct: 0.2 mg/dL (ref 0.0–0.3)
Total Bilirubin: 1.1 mg/dL (ref 0.2–1.2)
Total Protein: 6.9 g/dL (ref 6.0–8.3)

## 2019-05-14 LAB — LIPID PANEL
Cholesterol: 218 mg/dL — ABNORMAL HIGH (ref 0–200)
HDL: 65.8 mg/dL (ref >39.00)
LDL Cholesterol: 125 mg/dL — ABNORMAL HIGH (ref 0–99)
NonHDL: 152.6
Total CHOL/HDL Ratio: 3
Triglycerides: 137 mg/dL (ref 0.0–149.0)
VLDL: 27.4 mg/dL (ref 0.0–40.0)

## 2019-05-14 MED ORDER — SILDENAFIL CITRATE 100 MG PO TABS: 100.0000 mg | ORAL_TABLET | Freq: Every day | ORAL | 11 refills | Status: DC | PRN

## 2019-05-14 NOTE — Progress Notes (Signed)
Subjective:    Patient ID: John Trujillo, male    DOB: 1946/02/12, 74 y.o.   MRN: [id number]  HPI Here to follow up on issues. He feels well. He still gets up to urinate at night 2-3 times but he tolerates this well. The Viagra helps with the ED. He plays golf 2-3 days a week.    Review of Systems  Constitutional: Negative.   HENT: Negative.   Eyes: Negative.   Respiratory: Negative.   Cardiovascular: Negative.   Gastrointestinal: Negative.   Genitourinary: Negative.   Musculoskeletal: Negative.   Skin: Negative.   Neurological: Negative.   Psychiatric/Behavioral: Negative.        Objective:   Physical Exam Constitutional:      General: He is not in acute distress.    Appearance: He is well-developed. He is not diaphoretic.  HENT:     Head: Normocephalic and atraumatic.     Right Ear: External ear normal.     Left Ear: External ear normal.     Nose: Nose normal.     Mouth/Throat:     Pharynx: No oropharyngeal exudate.  Eyes:     General: No scleral icterus.       Right eye: No discharge.        Left eye: No discharge.     Conjunctiva/sclera: Conjunctivae normal.     Pupils: Pupils are equal, round, and reactive to light.  Neck:     Thyroid: No thyromegaly.     Vascular: No JVD.     Trachea: No tracheal deviation.  Cardiovascular:     Rate and Rhythm: Normal rate and regular rhythm.     Heart sounds: Normal heart sounds. No murmur. No friction rub. No gallop.   Pulmonary:     Effort: Pulmonary effort is normal. No respiratory distress.     Breath sounds: Normal breath sounds. No wheezing or rales.  Chest:     Chest wall: No tenderness.  Abdominal:     General: Bowel sounds are normal. There is no distension.     Palpations: Abdomen is soft. There is no mass.     Tenderness: There is no abdominal tenderness. There is no guarding or rebound.  Genitourinary:    Penis: Normal. No tenderness.      Testes: Normal.     Prostate: Normal.     Rectum: Normal.  Guaiac result negative.  Musculoskeletal:        General: No tenderness. Normal range of motion.     Cervical back: Neck supple.  Lymphadenopathy:     Cervical: No cervical adenopathy.  Skin:    General: Skin is warm and dry.     Coloration: Skin is not pale.     Findings: No erythema or rash.  Neurological:     Mental Status: He is alert and oriented to person, place, and time.     Cranial Nerves: No cranial nerve deficit.     Motor: No abnormal muscle tone.     Coordination: Coordination normal.     Deep Tendon Reflexes: Reflexes are normal and symmetric. Reflexes normal.  Psychiatric:        Behavior: Behavior normal.        Thought Content: Thought content normal.        Judgment: Judgment normal.           Assessment & Plan:  His BPH and ED are stable. Get fasting labs today. He is due for another colonoscopy next month. Annie Main  Sarajane Jews, MD

## 2019-05-20 ENCOUNTER — Ambulatory Visit: Payer: Medicare Other | Attending: Internal Medicine

## 2019-05-20 DIAGNOSIS — Z23 Encounter for immunization: Secondary | ICD-10-CM | POA: Insufficient documentation

## 2019-06-10 ENCOUNTER — Ambulatory Visit: Payer: Medicare Other | Attending: Internal Medicine

## 2019-06-10 DIAGNOSIS — Z23 Encounter for immunization: Secondary | ICD-10-CM

## 2019-06-10 NOTE — Progress Notes (Signed)
   Covid-19 Vaccination Clinic  Name:  GABRYEL GOETZINGER    MRN: [id number] DOB: 04-13-1946  06/10/2019  Mr. Walczak was observed post Covid-19 immunization for 15 minutes without incidence. He was provided with Vaccine Information Sheet and instruction to access the V-Safe system.   Mr. Turowski was instructed to call 911 with any severe reactions post vaccine: Marland Kitchen Difficulty breathing  . Swelling of your face and throat  . A fast heartbeat  . A bad rash all over your body  . Dizziness and weakness    Immunizations Administered    Name Date Dose VIS Date Route   Pfizer COVID-19 Vaccine 06/10/2019  1:12 PM 0.3 mL 04/10/2019 Intramuscular   Manufacturer: [place]   Lot: [id number]   [place]: [id number]

## 2019-07-20 ENCOUNTER — Encounter: Payer: Self-pay | Admitting: Family Medicine

## 2019-10-17 IMAGING — DX DG PORTABLE PELVIS
1 series · 1 of 1 positions shown · non-contrast
Comparison: None.

CLINICAL DATA: Status post right total hip replacement.

EXAM:
PORTABLE PELVIS 1-2 VIEWS

[pelvis ap]
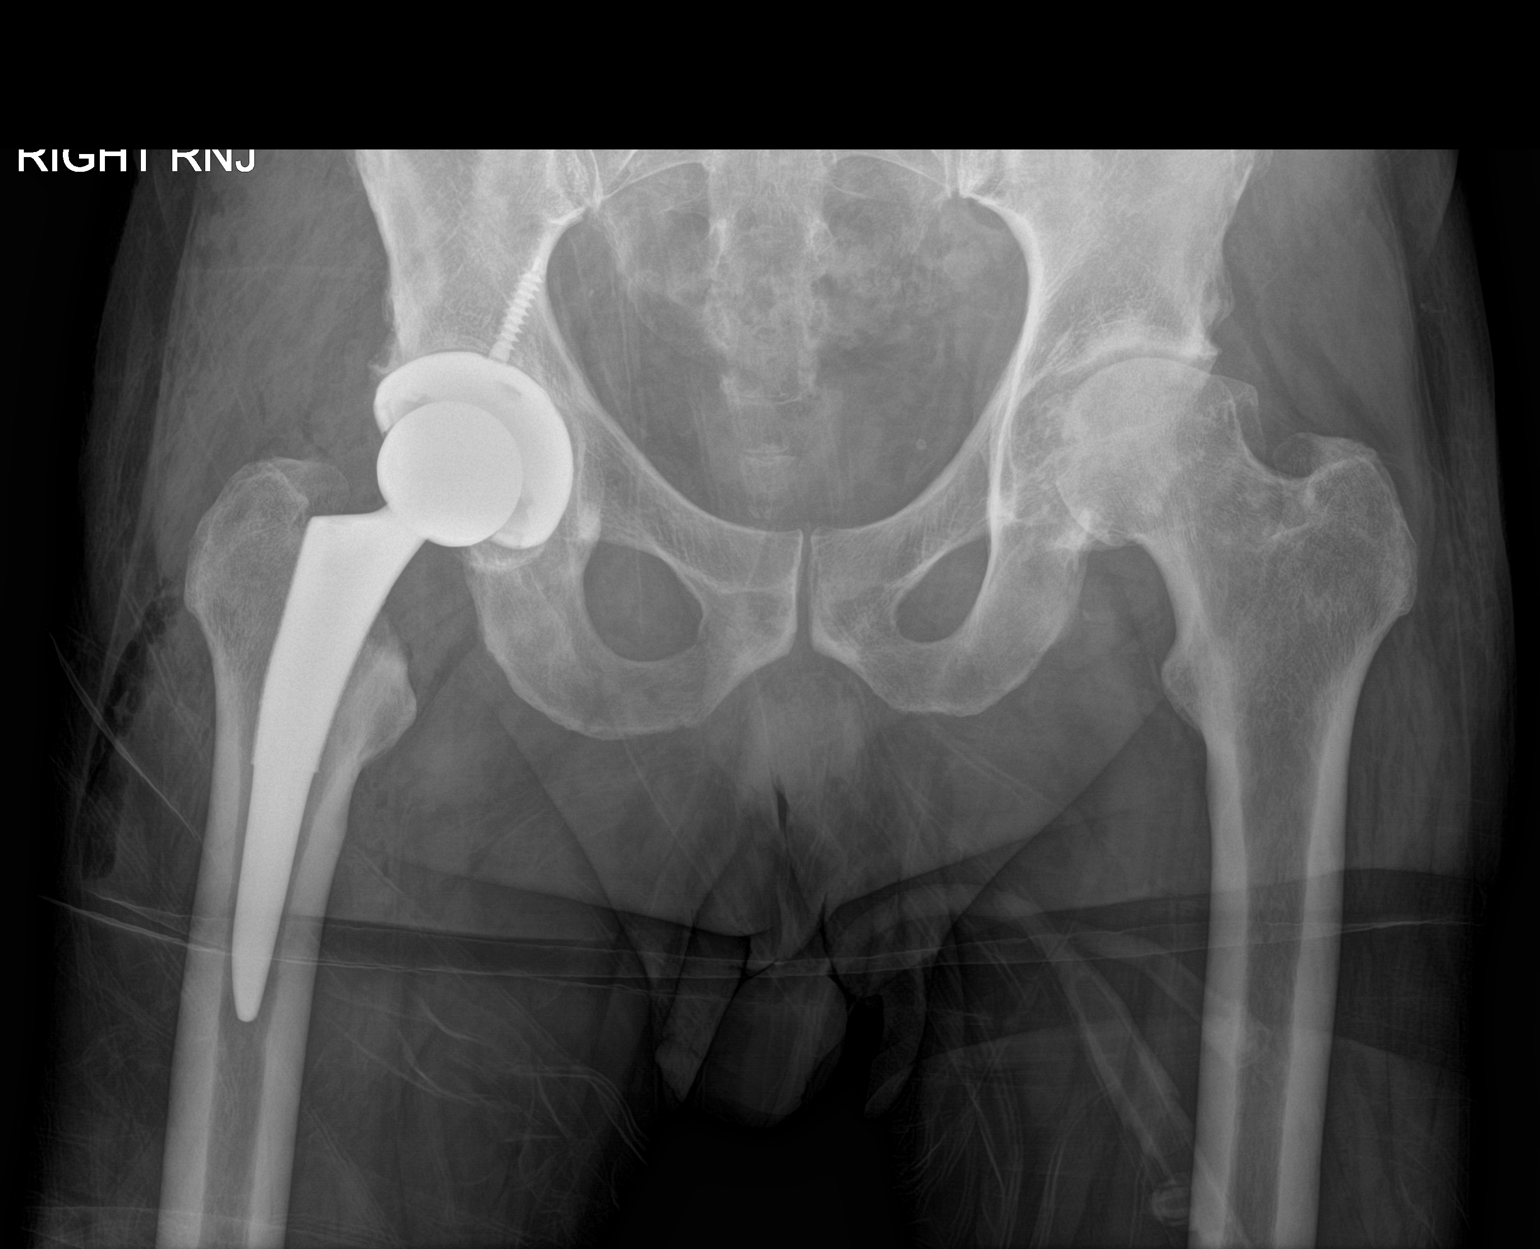

[1 of 1 positions shown; findings below may reference images not displayed]

FINDINGS: The femoral and acetabular components appear to be well situated.
Expected postoperative changes are seen in the surrounding soft
tissues. No fracture or dislocation is noted. Mild degenerative
changes seen involving the left hip.
IMPRESSION: Status post right total hip arthroplasty.

## 2019-11-28 IMAGING — DX DG PORTABLE PELVIS
1 series · 1 of 1 positions shown · non-contrast
Comparison: No recent prior.

CLINICAL DATA: Hip arthroplasty.

EXAM:
PORTABLE PELVIS 1-2 VIEWS

[pelvis ap]
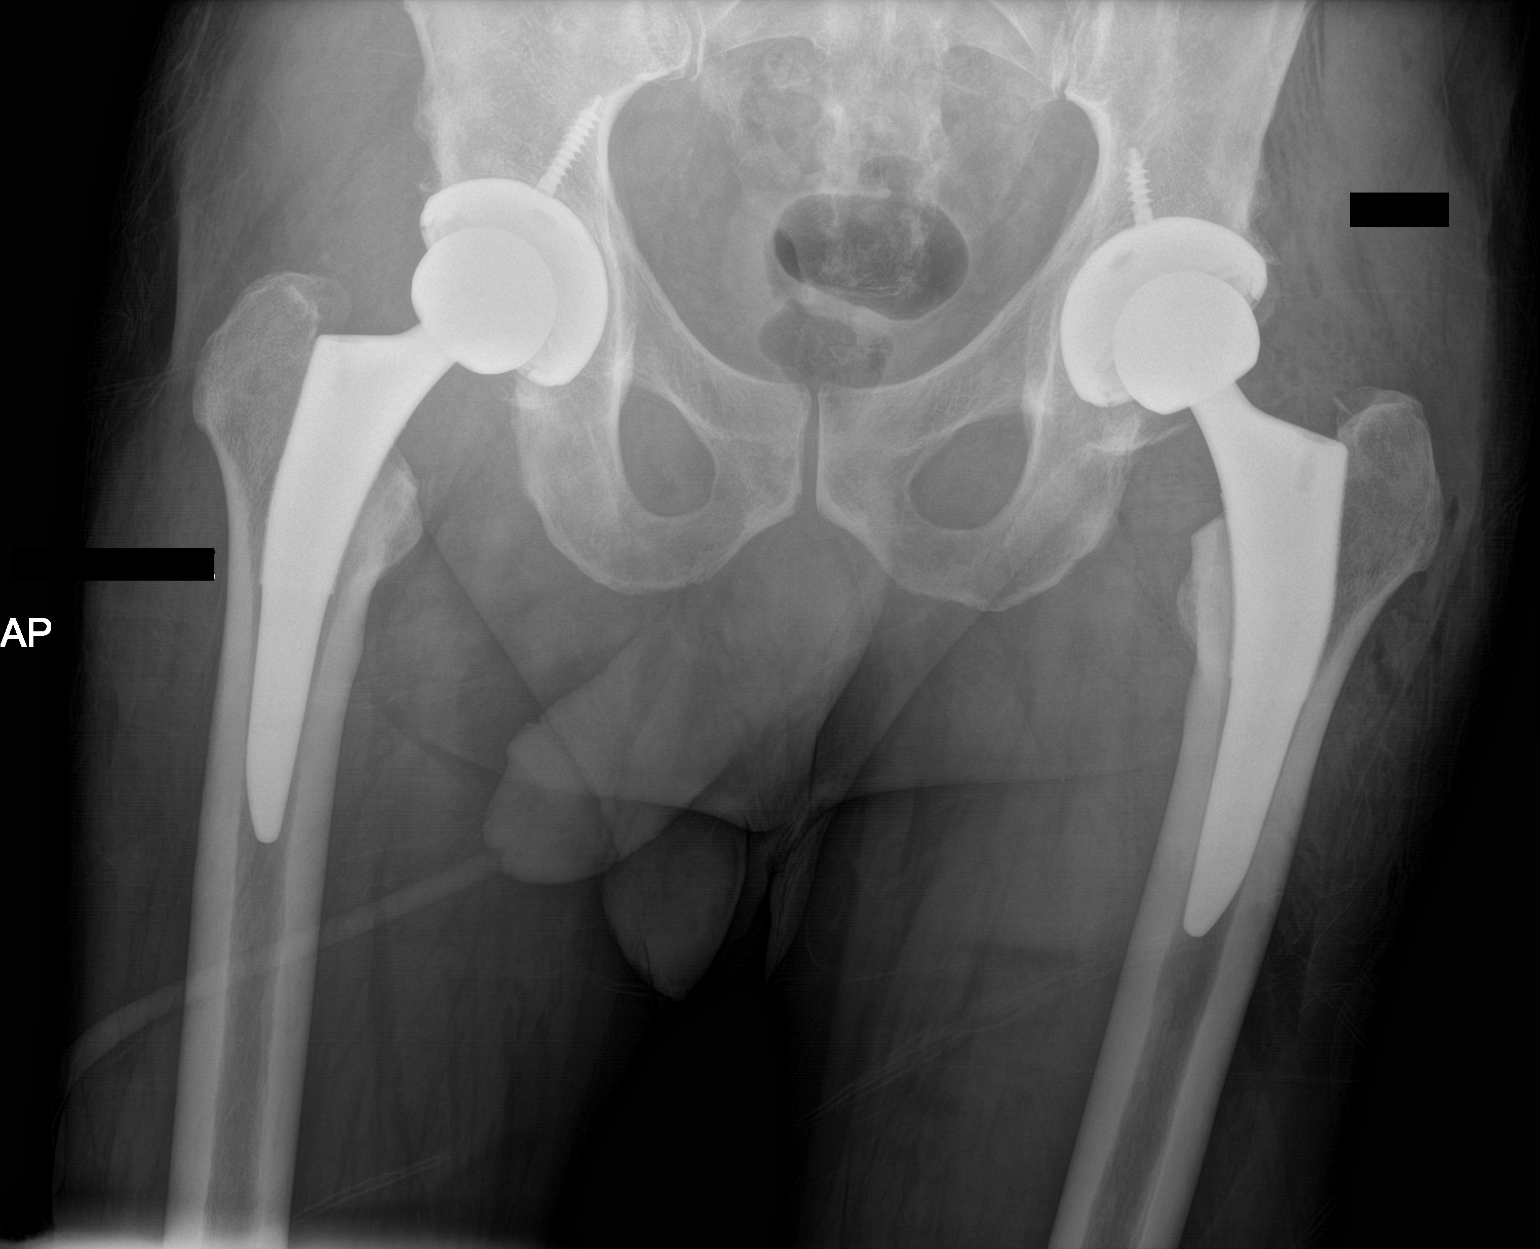

[1 of 1 positions shown; findings below may reference images not displayed]

FINDINGS: Left hip replacement. Hardware intact. Anatomic alignment. Prior
right hip replacement appears stable. Recent left hip replacement
IMPRESSION: Left hip replacement with anatomic alignment.

## 2019-12-28 DIAGNOSIS — M25561 Pain in right knee: Secondary | ICD-10-CM | POA: Diagnosis not present

## 2020-02-03 DIAGNOSIS — M25561 Pain in right knee: Secondary | ICD-10-CM | POA: Diagnosis not present

## 2020-02-11 DIAGNOSIS — M25561 Pain in right knee: Secondary | ICD-10-CM | POA: Diagnosis not present

## 2020-02-19 DIAGNOSIS — M25561 Pain in right knee: Secondary | ICD-10-CM | POA: Diagnosis not present

## 2020-05-11 ENCOUNTER — Ambulatory Visit: Payer: Medicare Other

## 2020-07-05 ENCOUNTER — Telehealth: Payer: Self-pay | Admitting: Family Medicine

## 2020-07-05 NOTE — Telephone Encounter (Signed)
Left message for patient to call back and schedule Medicare Annual Wellness Visit (AWV) either virtually or in office. No detailed message left    Last AWV 04/17/2019  please schedule at anytime with [facility] Nurse Health Advisor 1 or 2   This should be a 45 minute visit.

## 2020-09-21 ENCOUNTER — Other Ambulatory Visit: Payer: Self-pay

## 2020-09-22 ENCOUNTER — Encounter: Payer: Self-pay | Admitting: Family Medicine

## 2020-09-22 ENCOUNTER — Ambulatory Visit (INDEPENDENT_AMBULATORY_CARE_PROVIDER_SITE_OTHER): Payer: Medicare Other | Admitting: Family Medicine

## 2020-09-22 VITALS — BP 110/78 | HR 79 | Temp 98.5°F | Wt 174.0 lb

## 2020-09-22 DIAGNOSIS — N390 Urinary tract infection, site not specified: Secondary | ICD-10-CM

## 2020-09-22 DIAGNOSIS — R300 Dysuria: Secondary | ICD-10-CM

## 2020-09-22 LAB — POC URINALSYSI DIPSTICK (AUTOMATED)
Glucose, UA: NEGATIVE
Nitrite, UA: NEGATIVE
Protein, UA: POSITIVE — AB
Spec Grav, UA: 1.025 (ref 1.010–1.025)
Urobilinogen, UA: 1 E.U./dL
pH, UA: 5.5 (ref 5.0–8.0)

## 2020-09-22 MED ORDER — CIPROFLOXACIN HCL 500 MG PO TABS: 500.0000 mg | ORAL_TABLET | Freq: Two times a day (BID) | ORAL | 0 refills | Status: AC

## 2020-09-22 NOTE — Progress Notes (Signed)
   Subjective:    Patient ID: John Trujillo, male    DOB: 1946-02-22, 75 y.o.   MRN: [id number]  HPI Here for a possible UTI. He has had 3 days of urinary urgency and burning. No fever or back pain. Drinking lots of water and cranberry juice.    Review of Systems  Constitutional: Negative.   Respiratory: Negative.   Cardiovascular: Negative.   Gastrointestinal: Negative.   Genitourinary: Positive for dysuria, frequency and urgency. Negative for flank pain and hematuria.       Objective:   Physical Exam Constitutional:      Appearance: Normal appearance.  Cardiovascular:     Rate and Rhythm: Normal rate and regular rhythm.     Pulses: Normal pulses.     Heart sounds: Normal heart sounds.  Pulmonary:     Effort: Pulmonary effort is normal.     Breath sounds: Normal breath sounds.  Abdominal:     General: Abdomen is flat. Bowel sounds are normal. There is no distension.     Palpations: Abdomen is soft. There is no mass.     Tenderness: There is no abdominal tenderness. There is no guarding or rebound.     Hernia: No hernia is present.  Neurological:     Mental Status: He is alert.           Assessment & Plan:  UTI, treat with Cipro for 10 days. Culture the sample.  Alysia Penna, MD

## 2020-09-23 LAB — URINE CULTURE
MICRO NUMBER:: 11938530
Result:: NO GROWTH
SPECIMEN QUALITY:: ADEQUATE

## 2021-02-03 DIAGNOSIS — H5213 Myopia, bilateral: Secondary | ICD-10-CM | POA: Diagnosis not present

## 2021-04-17 ENCOUNTER — Ambulatory Visit (INDEPENDENT_AMBULATORY_CARE_PROVIDER_SITE_OTHER): Payer: Medicare Other | Admitting: Family Medicine

## 2021-04-17 ENCOUNTER — Encounter: Payer: Self-pay | Admitting: Family Medicine

## 2021-04-17 VITALS — BP 118/80 | HR 60 | Temp 97.8°F | Ht 71.0 in | Wt 176.6 lb

## 2021-04-17 DIAGNOSIS — N529 Male erectile dysfunction, unspecified: Secondary | ICD-10-CM

## 2021-04-17 DIAGNOSIS — E785 Hyperlipidemia, unspecified: Secondary | ICD-10-CM

## 2021-04-17 DIAGNOSIS — R739 Hyperglycemia, unspecified: Secondary | ICD-10-CM

## 2021-04-17 DIAGNOSIS — N401 Enlarged prostate with lower urinary tract symptoms: Secondary | ICD-10-CM

## 2021-04-17 DIAGNOSIS — N138 Other obstructive and reflux uropathy: Secondary | ICD-10-CM

## 2021-04-17 DIAGNOSIS — M159 Polyosteoarthritis, unspecified: Secondary | ICD-10-CM

## 2021-04-17 MED ORDER — SILDENAFIL CITRATE 100 MG PO TABS: 100.0000 mg | ORAL_TABLET | Freq: Every day | ORAL | 11 refills | Status: DC | PRN

## 2021-04-17 NOTE — Progress Notes (Signed)
Subjective:    Patient ID: John Trujillo, male    DOB: [birth year], 75 y.o.   MRN: [id number]  HPI Here to follow up on issues. He feels well in general. He uses Viagra for ED and this works fairly well. He has also had more trouble the past year with a slow urine stream and nocturia 2-4 times a night. There is no discomfort. His joint pains are controlled.    Review of Systems  Constitutional: Negative.   HENT: Negative.    Eyes: Negative.   Respiratory: Negative.    Cardiovascular: Negative.   Gastrointestinal: Negative.   Genitourinary:  Positive for difficulty urinating.  Musculoskeletal:  Positive for arthralgias.  Skin: Negative.   Neurological: Negative.   Psychiatric/Behavioral: Negative.        Objective:   Physical Exam Constitutional:      General: He is not in acute distress.    Appearance: Normal appearance. He is well-developed. He is not diaphoretic.  HENT:     Head: Normocephalic and atraumatic.     Right Ear: External ear normal.     Left Ear: External ear normal.     Nose: Nose normal.     Mouth/Throat:     Pharynx: No oropharyngeal exudate.  Eyes:     General: No scleral icterus.       Right eye: No discharge.        Left eye: No discharge.     Conjunctiva/sclera: Conjunctivae normal.     Pupils: Pupils are equal, round, and reactive to light.  Neck:     Thyroid: No thyromegaly.     Vascular: No JVD.     Trachea: No tracheal deviation.  Cardiovascular:     Rate and Rhythm: Normal rate and regular rhythm.     Heart sounds: Normal heart sounds. No murmur heard.   No friction rub. No gallop.  Pulmonary:     Effort: Pulmonary effort is normal. No respiratory distress.     Breath sounds: Normal breath sounds. No wheezing or rales.  Chest:     Chest wall: No tenderness.  Abdominal:     General: Bowel sounds are normal. There is no distension.     Palpations: Abdomen is soft. There is no mass.     Tenderness: There is no abdominal tenderness.  There is no guarding or rebound.  Genitourinary:    Penis: Normal. No tenderness.      Testes: Normal.     Rectum: Normal. Guaiac result negative.     Comments: Prostate mildly enlarged but smooth  Musculoskeletal:        General: No tenderness. Normal range of motion.     Cervical back: Neck supple.  Lymphadenopathy:     Cervical: No cervical adenopathy.  Skin:    General: Skin is warm and dry.     Coloration: Skin is not pale.     Findings: No erythema or rash.  Neurological:     Mental Status: He is alert and oriented to person, place, and time.     Cranial Nerves: No cranial nerve deficit.     Motor: No abnormal muscle tone.     Coordination: Coordination normal.     Deep Tendon Reflexes: Reflexes are normal and symmetric. Reflexes normal.  Psychiatric:        Behavior: Behavior normal.        Thought Content: Thought content normal.        Judgment: Judgment normal.  Assessment & Plan:  His OA and ED are stable. We refilled the Viagra. He has some BPH symptoms, so he can try saw palmetto OTC for this. We will get fasting labs to check his lipids, etc. We spent a total of ( 33  ) minutes reviewing records and discussing these issues.  Alysia Penna, MD

## 2021-04-18 ENCOUNTER — Other Ambulatory Visit (INDEPENDENT_AMBULATORY_CARE_PROVIDER_SITE_OTHER): Payer: Medicare Other

## 2021-04-18 DIAGNOSIS — N401 Enlarged prostate with lower urinary tract symptoms: Secondary | ICD-10-CM

## 2021-04-18 DIAGNOSIS — R739 Hyperglycemia, unspecified: Secondary | ICD-10-CM

## 2021-04-18 DIAGNOSIS — E785 Hyperlipidemia, unspecified: Secondary | ICD-10-CM | POA: Diagnosis not present

## 2021-04-18 DIAGNOSIS — N138 Other obstructive and reflux uropathy: Secondary | ICD-10-CM | POA: Diagnosis not present

## 2021-04-18 LAB — HEPATIC FUNCTION PANEL
ALT: 22 U/L (ref 0–53)
AST: 17 U/L (ref 0–37)
Albumin: 4.1 g/dL (ref 3.5–5.2)
Alkaline Phosphatase: 55 U/L (ref 39–117)
Bilirubin, Direct: 0.2 mg/dL (ref 0.0–0.3)
Total Bilirubin: 1 mg/dL (ref 0.2–1.2)
Total Protein: 6.7 g/dL (ref 6.0–8.3)

## 2021-04-18 LAB — CBC WITH DIFFERENTIAL/PLATELET
Basophils Absolute: 0.1 10*3/uL (ref 0.0–0.1)
Basophils Relative: 1.8 % (ref 0.0–3.0)
Eosinophils Absolute: 0.2 10*3/uL (ref 0.0–0.7)
Eosinophils Relative: 3.6 % (ref 0.0–5.0)
HCT: 44 % (ref 39.0–52.0)
Hemoglobin: 15 g/dL (ref 13.0–17.0)
Lymphocytes Relative: 32.1 % (ref 12.0–46.0)
Lymphs Abs: 1.4 10*3/uL (ref 0.7–4.0)
MCHC: 34.2 g/dL (ref 30.0–36.0)
MCV: 92.2 fl (ref 78.0–100.0)
Monocytes Absolute: 0.4 10*3/uL (ref 0.1–1.0)
Monocytes Relative: 9.4 % (ref 3.0–12.0)
Neutro Abs: 2.3 10*3/uL (ref 1.4–7.7)
Neutrophils Relative %: 53.1 % (ref 43.0–77.0)
Platelets: 216 10*3/uL (ref 150.0–400.0)
RBC: 4.77 Mil/uL (ref 4.22–5.81)
RDW: 13.6 % (ref 11.5–15.5)
WBC: 4.3 10*3/uL (ref 4.0–10.5)

## 2021-04-18 LAB — LIPID PANEL
Cholesterol: 199 mg/dL (ref 0–200)
HDL: 64.4 mg/dL (ref >39.00)
LDL Cholesterol: 107 mg/dL — ABNORMAL HIGH (ref 0–99)
NonHDL: 134.11
Total CHOL/HDL Ratio: 3
Triglycerides: 136 mg/dL (ref 0.0–149.0)
VLDL: 27.2 mg/dL (ref 0.0–40.0)

## 2021-04-18 LAB — BASIC METABOLIC PANEL
BUN: 19 mg/dL (ref 6–23)
CO2: 29 mEq/L (ref 19–32)
Calcium: 9.7 mg/dL (ref 8.4–10.5)
Chloride: 101 mEq/L (ref 96–112)
Creatinine, Ser: 1.16 mg/dL (ref 0.40–1.50)
GFR: 62.16 mL/min (ref >60.00)
Glucose, Bld: 84 mg/dL (ref 70–99)
Potassium: 4.3 mEq/L (ref 3.5–5.1)
Sodium: 137 mEq/L (ref 135–145)

## 2021-04-18 LAB — TSH: TSH: 2.89 u[IU]/mL (ref 0.35–5.50)

## 2021-04-18 LAB — HEMOGLOBIN A1C: Hgb A1c MFr Bld: 4.9 % (ref 4.6–6.5)

## 2021-04-18 LAB — PSA: PSA: 2.05 ng/mL (ref 0.10–4.00)

## 2021-04-19 ENCOUNTER — Encounter: Payer: Self-pay | Admitting: Family Medicine

## 2021-04-20 ENCOUNTER — Other Ambulatory Visit: Payer: Self-pay

## 2021-04-20 DIAGNOSIS — N529 Male erectile dysfunction, unspecified: Secondary | ICD-10-CM

## 2021-04-20 MED ORDER — SILDENAFIL CITRATE 100 MG PO TABS: 100.0000 mg | ORAL_TABLET | Freq: Every day | ORAL | 11 refills | Status: DC | PRN

## 2021-07-20 ENCOUNTER — Telehealth: Payer: Self-pay | Admitting: Family Medicine

## 2021-07-20 NOTE — Telephone Encounter (Signed)
Left message for patient to call back and schedule Medicare Annual Wellness Visit (AWV) either virtually or in office. Left  my [name] number [phone] ? ? ?Last AWV 04/17/19 ?; please schedule at anytime with [facility] Nurse Health Advisor 1 or 2 ? ? ?This should be a 45 minute visit.  ?

## 2021-08-15 ENCOUNTER — Telehealth: Payer: Self-pay | Admitting: Family Medicine

## 2021-08-15 NOTE — Telephone Encounter (Signed)
Left message for patient to call back and schedule Medicare Annual Wellness Visit (AWV) either virtually or in office. Left  my [name] number [phone] ? ? ?Last AWV 04/17/19 ?; please schedule at anytime with [facility] Nurse Health Advisor 1 or 2 ? ? ?

## 2021-09-13 ENCOUNTER — Telehealth: Payer: Self-pay | Admitting: Family Medicine

## 2021-09-13 ENCOUNTER — Ambulatory Visit (INDEPENDENT_AMBULATORY_CARE_PROVIDER_SITE_OTHER): Payer: Medicare Other

## 2021-09-13 VITALS — Ht 71.0 in | Wt 176.0 lb

## 2021-09-13 DIAGNOSIS — Z1211 Encounter for screening for malignant neoplasm of colon: Secondary | ICD-10-CM | POA: Diagnosis not present

## 2021-09-13 DIAGNOSIS — Z0000 Encounter for general adult medical examination without abnormal findings: Secondary | ICD-10-CM

## 2021-09-13 NOTE — Telephone Encounter (Signed)
Camille from [facility] called regarding a PA follow up for screening virtual colonoscopy. She will need a callback on this by May 25th  ? ? ? ? ? ? ? ? ?Please advise  ?

## 2021-09-13 NOTE — Patient Instructions (Addendum)
John Trujillo , Thank you for taking time to come for your Medicare Wellness Visit. I appreciate your ongoing commitment to your health goals. Please review the following plan we discussed and let me know if I can assist you in the future.   These are the goals we discussed:  Goals       Increase physical activity      Specifically increase intensity      Increase physical activity (pt-stated)      I would like to go to gym more.      Weight (lb) < 172 lb (78 kg)        This is a list of the screening recommended for you and due dates:  Health Maintenance  Topic Date Due   Pneumonia Vaccine (3) 09/14/2022*   Colon Cancer Screening  09/14/2022*   Tetanus Vaccine  09/14/2022*   Flu Shot  11/28/2021   Hepatitis C Screening: USPSTF Recommendation to screen - Ages 18-79 yo.  Completed   Zoster (Shingles) Vaccine  Completed   HPV Vaccine  Aged Out   COVID-19 Vaccine  Discontinued  *Topic was postponed. The date shown is not the original due date.    Advanced directives: Yes  Conditions/risks identified: None  Next appointment: Follow up in one year for your annual wellness visit.    Preventive Care 75 Years and Older, Male Preventive care refers to lifestyle choices and visits with your health care provider that can promote health and wellness. What does preventive care include? A yearly physical exam. This is also called an annual well check. Dental exams once or twice a year. Routine eye exams. Ask your health care provider how often you should have your eyes checked. Personal lifestyle choices, including: Daily care of your teeth and gums. Regular physical activity. Eating a healthy diet. Avoiding tobacco and drug use. Limiting alcohol use. Practicing safe sex. Taking low doses of aspirin every day. Taking vitamin and mineral supplements as recommended by your health care provider. What happens during an annual well check? The services and screenings done by your  health care provider during your annual well check will depend on your age, overall health, lifestyle risk factors, and family history of disease. Counseling  Your health care provider may ask you questions about your: Alcohol use. Tobacco use. Drug use. Emotional well-being. Home and relationship well-being. Sexual activity. Eating habits. History of falls. Memory and ability to understand (cognition). Work and work Astronomer. Screening  You may have the following tests or measurements: Height, weight, and BMI. Blood pressure. Lipid and cholesterol levels. These may be checked every 5 years, or more frequently if you are over 70 years old. Skin check. Lung cancer screening. You may have this screening every year starting at age 75 if you have a 30-pack-year history of smoking and currently smoke or have quit within the past 15 years. Fecal occult blood test (FOBT) of the stool. You may have this test every year starting at age 75. Flexible sigmoidoscopy or colonoscopy. You may have a sigmoidoscopy every 5 years or a colonoscopy every 10 years starting at age 75. Prostate cancer screening. Recommendations will vary depending on your family history and other risks. Hepatitis C blood test. Hepatitis B blood test. Sexually transmitted disease (STD) testing. Diabetes screening. This is done by checking your blood sugar (glucose) after you have not eaten for a while (fasting). You may have this done every 1-3 years. Abdominal aortic aneurysm (AAA) screening. You may need this  if you are a current or former smoker. Osteoporosis. You may be screened starting at age 75 if you are at high risk. Talk with your health care provider about your test results, treatment options, and if necessary, the need for more tests. Vaccines  Your health care provider may recommend certain vaccines, such as: Influenza vaccine. This is recommended every year. Tetanus, diphtheria, and acellular pertussis  (Tdap, Td) vaccine. You may need a Td booster every 10 years. Zoster vaccine. You may need this after age 26. Pneumococcal 13-valent conjugate (PCV13) vaccine. One dose is recommended after age 25. Pneumococcal polysaccharide (PPSV23) vaccine. One dose is recommended after age 66. Talk to your health care provider about which screenings and vaccines you need and how often you need them. This information is not intended to replace advice given to you by your health care provider. Make sure you discuss any questions you have with your health care provider. Document Released: 05/13/2015 Document Revised: 01/04/2016 Document Reviewed: 02/15/2015 Elsevier Interactive Patient Education  2017 [employer].  Fall Prevention in the Home Falls can cause injuries. They can happen to people of all ages. There are many things you can do to make your home safe and to help prevent falls. What can I do on the outside of my home? Regularly fix the edges of walkways and driveways and fix any cracks. Remove anything that might make you trip as you walk through a door, such as a raised step or threshold. Trim any bushes or trees on the path to your home. Use bright outdoor lighting. Clear any walking paths of anything that might make someone trip, such as rocks or tools. Regularly check to see if handrails are loose or broken. Make sure that both sides of any steps have handrails. Any raised decks and porches should have guardrails on the edges. Have any leaves, snow, or ice cleared regularly. Use sand or salt on walking paths during winter. Clean up any spills in your garage right away. This includes oil or grease spills. What can I do in the bathroom? Use night lights. Install grab bars by the toilet and in the tub and shower. Do not use towel bars as grab bars. Use non-skid mats or decals in the tub or shower. If you need to sit down in the shower, use a plastic, non-slip stool. Keep the floor dry. Clean  up any water that spills on the floor as soon as it happens. Remove soap buildup in the tub or shower regularly. Attach bath mats securely with double-sided non-slip rug tape. Do not have throw rugs and other things on the floor that can make you trip. What can I do in the bedroom? Use night lights. Make sure that you have a light by your bed that is easy to reach. Do not use any sheets or blankets that are too big for your bed. They should not hang down onto the floor. Have a firm chair that has side arms. You can use this for support while you get dressed. Do not have throw rugs and other things on the floor that can make you trip. What can I do in the kitchen? Clean up any spills right away. Avoid walking on wet floors. Keep items that you use a lot in easy-to-reach places. If you need to reach something above you, use a strong step stool that has a grab bar. Keep electrical cords out of the way. Do not use floor polish or wax that makes floors slippery.  If you must use wax, use non-skid floor wax. Do not have throw rugs and other things on the floor that can make you trip. What can I do with my stairs? Do not leave any items on the stairs. Make sure that there are handrails on both sides of the stairs and use them. Fix handrails that are broken or loose. Make sure that handrails are as long as the stairways. Check any carpeting to make sure that it is firmly attached to the stairs. Fix any carpet that is loose or worn. Avoid having throw rugs at the top or bottom of the stairs. If you do have throw rugs, attach them to the floor with carpet tape. Make sure that you have a light switch at the top of the stairs and the bottom of the stairs. If you do not have them, ask someone to add them for you. What else can I do to help prevent falls? Wear shoes that: Do not have high heels. Have rubber bottoms. Are comfortable and fit you well. Are closed at the toe. Do not wear sandals. If you  use a stepladder: Make sure that it is fully opened. Do not climb a closed stepladder. Make sure that both sides of the stepladder are locked into place. Ask someone to hold it for you, if possible. Clearly mark and make sure that you can see: Any grab bars or handrails. First and last steps. Where the edge of each step is. Use tools that help you move around (mobility aids) if they are needed. These include: Canes. Walkers. Scooters. Crutches. Turn on the lights when you go into a dark area. Replace any light bulbs as soon as they burn out. Set up your furniture so you have a clear path. Avoid moving your furniture around. If any of your floors are uneven, fix them. If there are any pets around you, be aware of where they are. Review your medicines with your doctor. Some medicines can make you feel dizzy. This can increase your chance of falling. Ask your doctor what other things that you can do to help prevent falls. This information is not intended to replace advice given to you by your health care provider. Make sure you discuss any questions you have with your health care provider. Document Released: 02/10/2009 Document Revised: 09/22/2015 Document Reviewed: 05/21/2014 Elsevier Interactive Patient Education  2017 [employer].

## 2021-09-13 NOTE — Progress Notes (Signed)
Subjective:   John Trujillo is a 76 y.o. male who presents for Medicare Annual/Subsequent preventive examination.  Review of Systems    Virtual Visit via Telephone Note  I connected with  John Trujillo on 09/13/21 at  1:00 PM EDT by telephone and verified that I am speaking with the correct person using two identifiers.  Location: Patient: Home Provider: Office Persons participating in the virtual visit: patient/Nurse Health Advisor   I discussed the limitations, risks, security and privacy concerns of performing an evaluation and management service by telephone and the availability of in person appointments. The patient expressed understanding and agreed to proceed.  Interactive audio and video telecommunications were attempted between this nurse and patient, however failed, due to patient having technical difficulties OR patient did not have access to video capability.  We continued and completed visit with audio only.  Some vital signs may be absent or patient reported.   Criselda Peaches, LPN  Cardiac Risk Factors include: advanced age (>66mn, >>38women);male gender     Objective:    Today's Vitals   09/13/21 1305  Weight: 176 lb (79.8 kg)  Height: '5\' 11"'$  (1.803 m)   Body mass index is 24.55 kg/m.     09/13/2021    1:13 PM 04/17/2019    9:19 AM 05/07/2017    1:54 PM 05/02/2017   10:25 AM 03/26/2017    5:29 PM 03/20/2017   10:02 AM 05/20/2014    2:45 PM  Advanced Directives  Does Patient Have a Medical Advance Directive? Yes Yes Yes Yes Yes Yes Yes  Type of AP[place]of [place] will [place] will Living will Living will Living will;Healthcare Power of Attorney Living will;Healthcare [place] of [place] Does patient want to make changes to medical advance directive? No - Patient declined No - Patient declined No - Patient declined No - Patient declined No - Patient declined  No - Patient declined   Copy of [place] Chart? No - copy requested No - copy requested   No - copy requested      Current Medications (verified) Outpatient Encounter Medications as of 09/13/2021  Medication Sig   ferrous sulfate (FERROUSUL) 325 (65 FE) MG tablet Take 1 tablet (325 mg total) by mouth 3 (three) times daily with meals. (Patient not taking: Reported on 04/17/2021)   glucosamine-chondroitin 500-400 MG tablet Take 1 tablet by mouth 3 (three) times daily. (Patient not taking: Reported on 04/17/2021)   Moringa Oleifera (MORINGA PO) Take by mouth.   Multiple Vitamin (MULTIVITAMIN ADULT PO) Take by mouth.   Omega-3 Fatty Acids (FISH OIL) 1000 MG CAPS Take 1,000 mg by mouth daily.  (Patient not taking: Reported on 04/17/2021)   sildenafil (VIAGRA) 100 MG tablet Take 1 tablet (100 mg total) by mouth daily as needed for erectile dysfunction.   vitamin E 1000 UNIT capsule Take 1,000 Units by mouth daily.   No facility-administered encounter medications on file as of 09/13/2021.    Allergies (verified) Patient has no known allergies.   History: Past Medical History:  Diagnosis Date   Arthritis    in knees, has seen Dr. AWynelle Link   Coronary artery, anomalous origin 1995   anomalous artery off the LAD by cath pt. denies   Dyslipidemia    ED (erectile dysfunction)    History of hemorrhoids    Peyronie's disease    Prostatitis    Pyelonephritis  history of urinary retention   Tubulovillous adenoma of colon    Past Surgical History:  Procedure Laterality Date   COLONOSCOPY  06-04-14   per Dr. 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   Procedure: RIGHT TOTAL HIP ARTHROPLASTY ANTERIOR APPROACH;  Surgeon: Paralee Cancel, MD;  Location: [facility];  Service: Orthopedics;   Laterality: Right;  General   TOTAL HIP ARTHROPLASTY Left 05/07/2017   Procedure: LEFT TOTAL HIP ARTHROPLASTY ANTERIOR APPROACH;  Surgeon: Paralee Cancel, MD;  Location: [facility];  Service: Orthopedics;  Laterality: Left;  70 mins   Family History  Problem Relation Age of Onset   Cancer Father        Prostate cancer   Colon cancer Neg Hx    Rectal cancer Neg Hx    Stomach cancer Neg Hx    Social History   Socioeconomic History   Marital status: Married    Spouse name: Not on file   Number of children: 2   Years of education: 18   Highest education level: Not on file  Occupational History   Occupation: executive    Comment: retired  Tobacco Use   Smoking status: Never   Smokeless tobacco: Never  Vaping Use   Vaping Use: Never used  Substance and Sexual Activity   Alcohol use: Yes    Alcohol/week: 14.0 standard drinks    Types: 14 Standard drinks or equivalent per week    Comment: 2 glasses of wine each night   Drug use: No   Sexual activity: Yes    Partners: Female  Other Topics Concern   Not on file  Social History Narrative   [place] and [employer]. Married-'76. 1 son- '83 (lives at home); 1 Daughter- '80-married, PhD. Work - Retired Programme researcher, broadcasting/film/video from [place] '06 enjoys retirement and exercise.Wife -  Had aortic valve and root replacement '13, suffered MI post-op, had 45 min open heart massage followed by CABG. She has to have two stents placed by Liam Rogers '14. She is making a good recovery. marriage is in good health.            Social Determinants of Health   Financial Resource Strain: Low Risk    Difficulty of Paying Living Expenses: Not hard at all  Food Insecurity: No Food Insecurity   Worried About Charity fundraiser in the Last Year: Never true   [place] in the Last Year: Never true  Transportation Needs: No Transportation Needs   Lack of Transportation (Medical): No   Lack of Transportation (Non-Medical): No  Physical Activity:  Sufficiently Active   Days of Exercise per Week: 5 days   Minutes of Exercise per Session: 50 min  Stress: No Stress Concern Present   Feeling of Stress : Not at all  Social Connections: Socially Integrated   Frequency of Communication with Friends and Family: More than three times a week   Frequency of Social Gatherings with Friends and Family: More than three times a week   Attends Religious Services: More than 4 times per year   Active Member of [employer] or Organizations: Yes   Attends Archivist Meetings: More than 4 times per year   Marital Status: Married  Tobacco Counseling Counseling given: Not Answered   Clinical Intake:  Pre-visit preparation completed: Yes  Pain : No/denies pain     BMI - recorded: 24.64 Nutritional Status: BMI of 19-24  Normal Nutritional Risks: None Diabetes: No  How often do you need to have someone help you when you read instructions, pamphlets, or other written materials from your doctor or pharmacy?: 1 - Never  Diabetic?  No  Activities of Daily Living    09/13/2021    1:11 PM 09/11/2021    2:53 PM  In your present state of health, do you have any difficulty performing the following activities:  Hearing? 0 0  Vision? 0 0  Difficulty concentrating or making decisions? 0 0  Walking or climbing stairs? 0 0  Dressing or bathing? 0 0  Doing errands, shopping? 0 0  Preparing Food and eating ? N N  Using the Toilet? N N  In the past six months, have you accidently leaked urine? N N  Do you have problems with loss of bowel control? N N  Managing your Medications? N N  Managing your Finances? N N  Housekeeping or managing your Housekeeping? N N    Patient Care Team: Laurey Morale, MD as PCP - General (Family Medicine)  Indicate any recent Medical Services you may have received from other than [facility] providers in the past year (date may be approximate).     Assessment:   This is a routine wellness examination for  John Trujillo.  Hearing/Vision screen Hearing Screening - Comments:: No hearing difficulty Vision Screening - Comments:: Wears glasses. Follow[facility]  Dietary issues and exercise activities discussed: Exercise limited by: None identified   Goals Addressed               This Visit's Progress     Increase physical activity (pt-stated)        I would like to go to gym more.       Depression Screen    09/13/2021    1:09 PM 04/17/2021    2:07 PM 04/17/2019    9:20 AM 07/10/2016    4:16 PM 07/11/2015   10:48 AM 03/30/2014    2:22 PM  PHQ 2/9 Scores  PHQ - 2 Score 0 0 0 0 0 0  PHQ- 9 Score  0        Fall Risk    09/13/2021    1:12 PM 09/11/2021    2:53 PM 04/17/2021    2:07 PM 04/17/2019    9:20 AM 07/10/2016    4:16 PM  Fall Risk   Falls in the past year? 0 0 0 0 No  Number falls in past yr: 0      Injury with Fall? 0      Risk for fall due to : No Fall Risks        FALL RISK PREVENTION PERTAINING TO THE HOME:  Any stairs in or around the home? Yes  If so, are there any without handrails? No  Home free of loose throw rugs in walkways, pet beds, electrical cords, etc? Yes  Adequate lighting in your home to reduce risk of falls? Yes   ASSISTIVE DEVICES UTILIZED TO PREVENT FALLS:  Life alert? No  Use of a cane, walker or w/c? No  Grab bars in the bathroom? No  Shower chair or bench in shower? Yes  Elevated toilet seat or a handicapped toilet? No   TIMED UP AND GO:  Was  the test performed? No . Audio Visit  Cognitive Function:        09/13/2021    1:13 PM 04/17/2019    9:25 AM  6CIT Screen  What Year? 0 points 0 points  What month? 0 points 0 points  What time? 0 points 0 points  Count back from 20 0 points 0 points  Months in reverse 0 points 0 points  Repeat phrase 0 points 0 points  Total Score 0 points 0 points    Immunizations Immunization History  Administered Date(s) Administered   Fluad Quad(high Dose 65+) 01/15/2019    Influenza, High Dose Seasonal PF 03/17/2013, 02/03/2018   Influenza-Unspecified 03/31/2015, 02/23/2016, 02/05/2017, 02/15/2021   PFIZER(Purple Top)SARS-COV-2 Vaccination 05/20/2019, 06/10/2019   Pneumococcal Conjugate-13 08/03/2015   Pneumococcal Polysaccharide-23 01/14/2009   Td 01/14/2009   Unspecified SARS-COV-2 Vaccination 02/15/2021   Zoster Recombinat (Shingrix) 12/06/2017, 05/02/2018    TDAP status: Due, Education has been provided regarding the importance of this vaccine. Advised may receive this vaccine at local pharmacy or Health Dept. Aware to provide a copy of the vaccination record if obtained from local pharmacy or Health Dept. Verbalized acceptance and understanding.  Flu Vaccine status: Up to date  Pneumococcal vaccine status: Due, Education has been provided regarding the importance of this vaccine. Advised may receive this vaccine at local pharmacy or Health Dept. Aware to provide a copy of the vaccination record if obtained from local pharmacy or Health Dept. Verbalized acceptance and understanding.  Covid-19 vaccine status: Completed vaccines  Qualifies for Shingles Vaccine? Yes   Zostavax completed Yes   Shingrix Completed?: Yes  Screening Tests Health Maintenance  Topic Date Due   Pneumonia Vaccine 54+ Years old (63) 09/14/2022 (Originally 08/02/2016)   COLONOSCOPY (Pts 45-30yr Insurance coverage will need to be confirmed)  09/14/2022 (Originally 06/05/2019)   TETANUS/TDAP  09/14/2022 (Originally 01/15/2019)   INFLUENZA VACCINE  11/28/2021   Hepatitis C Screening  Completed   Zoster Vaccines- Shingrix  Completed   HPV VACCINES  Aged Out   COVID-19 Vaccine  Discontinued    Health Maintenance  There are no preventive care reminders to display for this patient.   Colorectal cancer screening: Referral to GI placed 09/13/21. Pt aware the office will call re: appt.  Lung Cancer Screening: (Low Dose CT Chest recommended if Age 75-80years, 30 pack-year currently  smoking OR have quit w/in 15years.) does not qualify.     Additional Screening:  Hepatitis C Screening: does qualify; Completed 08/03/15  Vision Screening: Recommended annual ophthalmology exams for early detection of glaucoma and other disorders of the eye. Is the patient up to date with their annual eye exam?  Yes  Who is the provider or what is the name of the office in which the patient attends annual eye exams? [facility] pt is not established with a provider, would they like to be referred to a provider to establish care? No .   Dental Screening: Recommended annual dental exams for proper oral hygiene  Community Resource Referral / Chronic Care Management:  CRR required this visit?  No   CCM required this visit?  No      Plan:     I have personally reviewed and noted the following in the patient's chart:   Medical and social history Use of alcohol, tobacco or illicit drugs  Current medications and supplements including opioid prescriptions. Patient is not currently taking opioid prescriptions. Functional ability and status Nutritional status Physical activity Advanced directives List  of other physicians Hospitalizations, surgeries, and ER visits in previous 12 months Vitals Screenings to include cognitive, depression, and falls Referrals and appointments  In addition, I have reviewed and discussed with patient certain preventive protocols, quality metrics, and best practice recommendations. A written personalized care plan for preventive services as well as general preventive health recommendations were provided to patient.     Criselda Peaches, LPN   1/61/0960   Nurse Notes: None

## 2021-09-21 NOTE — Telephone Encounter (Signed)
Attempted to call Rosendo Gros back regarding message below, no success in speaking with agent, will try later

## 2021-09-22 NOTE — Telephone Encounter (Signed)
Received paperwork regarding this message, Dr Sarajane Jews was not aware of the screening since it was placed by the wellness nurse. Dr Sarajane Jews is handling this problem

## 2022-01-02 ENCOUNTER — Telehealth: Payer: Self-pay | Admitting: *Deleted

## 2022-01-02 NOTE — Patient Outreach (Signed)
  Care Coordination   01/02/2022 Name: John Trujillo MRN: [id number] DOB: 1946-01-07   Care Coordination Outreach Attempts:  An unsuccessful telephone outreach was attempted today to offer the patient information about available care coordination services as a benefit of their health plan.   Follow Up Plan:  Additional outreach attempts will be made to offer the patient care coordination information and services.   Encounter Outcome:  No Answer  Care Coordination Interventions Activated:  No   Care Coordination Interventions:  No, not indicated    Raina Mina, RN Care Management Coordinator [place] [facility] [phone]

## 2022-01-24 ENCOUNTER — Telehealth: Payer: Self-pay

## 2022-01-24 NOTE — Patient Outreach (Signed)
  Care Coordination   01/24/2022 Name: EDVARDO HONSE MRN: [id number] DOB: 07/12/1945   Care Coordination Outreach Attempts:  A second unsuccessful outreach was attempted today to offer the patient with information about available care coordination services as a benefit of their health plan.     Follow Up Plan:  Additional outreach attempts will be made to offer the patient care coordination information and services.   Encounter Outcome:  No Answer  Care Coordination Interventions Activated:  No   Care Coordination Interventions:  No, not indicated    Peter Garter RN, BSN,CCM, [place] [facility] [phone]

## 2022-02-01 ENCOUNTER — Telehealth: Payer: Self-pay

## 2022-02-01 NOTE — Patient Outreach (Signed)
  Care Coordination   02/01/2022 Name: John Trujillo MRN: [id number] DOB: 10/27/1945   Care Coordination Outreach Attempts:  A third unsuccessful outreach was attempted today to offer the patient with information about available care coordination services as a benefit of their health plan.   Follow Up Plan:  No further outreach attempts will be made at this time. We have been unable to contact the patient to offer or enroll patient in care coordination services  Encounter Outcome:  No Answer  Care Coordination Interventions Activated:  No   Care Coordination Interventions:  No, not indicated    Peter Garter RN, BSN,CCM, [facility] [phone]

## 2022-05-30 ENCOUNTER — Ambulatory Visit (INDEPENDENT_AMBULATORY_CARE_PROVIDER_SITE_OTHER): Payer: Medicare HMO | Admitting: Family Medicine

## 2022-05-30 ENCOUNTER — Encounter: Payer: Self-pay | Admitting: Gastroenterology

## 2022-05-30 ENCOUNTER — Encounter: Payer: Self-pay | Admitting: Family Medicine

## 2022-05-30 VITALS — BP 120/80 | HR 51 | Temp 98.1°F | Ht 70.25 in | Wt 176.4 lb

## 2022-05-30 DIAGNOSIS — N138 Other obstructive and reflux uropathy: Secondary | ICD-10-CM | POA: Diagnosis not present

## 2022-05-30 DIAGNOSIS — E785 Hyperlipidemia, unspecified: Secondary | ICD-10-CM

## 2022-05-30 DIAGNOSIS — Z86010 Personal history of colonic polyps: Secondary | ICD-10-CM | POA: Diagnosis not present

## 2022-05-30 DIAGNOSIS — N529 Male erectile dysfunction, unspecified: Secondary | ICD-10-CM | POA: Diagnosis not present

## 2022-05-30 DIAGNOSIS — M159 Polyosteoarthritis, unspecified: Secondary | ICD-10-CM

## 2022-05-30 DIAGNOSIS — N401 Enlarged prostate with lower urinary tract symptoms: Secondary | ICD-10-CM

## 2022-05-30 DIAGNOSIS — R739 Hyperglycemia, unspecified: Secondary | ICD-10-CM

## 2022-05-30 LAB — HEMOGLOBIN A1C: Hgb A1c MFr Bld: 4.9 % (ref 4.6–6.5)

## 2022-05-30 LAB — LIPID PANEL
Cholesterol: 221 mg/dL — ABNORMAL HIGH (ref 0–200)
HDL: 76.8 mg/dL (ref >39.00)
LDL Cholesterol: 130 mg/dL — ABNORMAL HIGH (ref 0–99)
NonHDL: 144.53
Total CHOL/HDL Ratio: 3
Triglycerides: 75 mg/dL (ref 0.0–149.0)
VLDL: 15 mg/dL (ref 0.0–40.0)

## 2022-05-30 LAB — BASIC METABOLIC PANEL
BUN: 16 mg/dL (ref 6–23)
CO2: 29 mEq/L (ref 19–32)
Calcium: 9.6 mg/dL (ref 8.4–10.5)
Chloride: 100 mEq/L (ref 96–112)
Creatinine, Ser: 1 mg/dL (ref 0.40–1.50)
GFR: 73.7 mL/min (ref >60.00)
Glucose, Bld: 85 mg/dL (ref 70–99)
Potassium: 4.4 mEq/L (ref 3.5–5.1)
Sodium: 137 mEq/L (ref 135–145)

## 2022-05-30 LAB — CBC WITH DIFFERENTIAL/PLATELET
Basophils Absolute: 0.1 10*3/uL (ref 0.0–0.1)
Basophils Relative: 1.9 % (ref 0.0–3.0)
Eosinophils Absolute: 0.2 10*3/uL (ref 0.0–0.7)
Eosinophils Relative: 3.5 % (ref 0.0–5.0)
HCT: 43.3 % (ref 39.0–52.0)
Hemoglobin: 14.8 g/dL (ref 13.0–17.0)
Lymphocytes Relative: 24.4 % (ref 12.0–46.0)
Lymphs Abs: 1.1 10*3/uL (ref 0.7–4.0)
MCHC: 34.2 g/dL (ref 30.0–36.0)
MCV: 93.2 fl (ref 78.0–100.0)
Monocytes Absolute: 0.5 10*3/uL (ref 0.1–1.0)
Monocytes Relative: 11.1 % (ref 3.0–12.0)
Neutro Abs: 2.7 10*3/uL (ref 1.4–7.7)
Neutrophils Relative %: 59.1 % (ref 43.0–77.0)
Platelets: 222 10*3/uL (ref 150.0–400.0)
RBC: 4.65 Mil/uL (ref 4.22–5.81)
RDW: 13.8 % (ref 11.5–15.5)
WBC: 4.6 10*3/uL (ref 4.0–10.5)

## 2022-05-30 LAB — TSH: TSH: 2.04 u[IU]/mL (ref 0.35–5.50)

## 2022-05-30 LAB — HEPATIC FUNCTION PANEL
ALT: 25 U/L (ref 0–53)
AST: 24 U/L (ref 0–37)
Albumin: 4.3 g/dL (ref 3.5–5.2)
Alkaline Phosphatase: 53 U/L (ref 39–117)
Bilirubin, Direct: 0.2 mg/dL (ref 0.0–0.3)
Total Bilirubin: 0.9 mg/dL (ref 0.2–1.2)
Total Protein: 6.7 g/dL (ref 6.0–8.3)

## 2022-05-30 LAB — PSA: PSA: 2 ng/mL (ref 0.10–4.00)

## 2022-05-30 MED ORDER — SILDENAFIL CITRATE 100 MG PO TABS: 100.0000 mg | ORAL_TABLET | Freq: Every day | ORAL | 11 refills | Status: AC | PRN

## 2022-05-30 NOTE — Progress Notes (Signed)
Subjective:    Patient ID: John Trujillo, male    DOB: [birth year], 77 y.o.   MRN: [id number]  HPI Here to follow up on issues. He feels well in general but he does says he typically gets up 2-3 times at night to urinate, and the urine stream is slow. His ED is stable and he continues to benefit from using Sildenafil. His OA is stable. He is past due for a colonoscopy.    Review of Systems  Constitutional: Negative.   HENT: Negative.    Eyes: Negative.   Respiratory: Negative.    Cardiovascular: Negative.   Gastrointestinal: Negative.   Genitourinary:  Positive for frequency.  Musculoskeletal:  Positive for arthralgias.  Skin: Negative.   Neurological: Negative.   Psychiatric/Behavioral: Negative.         Objective:   Physical Exam Constitutional:      General: He is not in acute distress.    Appearance: Normal appearance. He is well-developed. He is not diaphoretic.  HENT:     Head: Normocephalic and atraumatic.     Right Ear: External ear normal.     Left Ear: External ear normal.     Nose: Nose normal.     Mouth/Throat:     Pharynx: No oropharyngeal exudate.  Eyes:     General: No scleral icterus.       Right eye: No discharge.        Left eye: No discharge.     Conjunctiva/sclera: Conjunctivae normal.     Pupils: Pupils are equal, round, and reactive to light.  Neck:     Thyroid: No thyromegaly.     Vascular: No JVD.     Trachea: No tracheal deviation.  Cardiovascular:     Rate and Rhythm: Normal rate and regular rhythm.     Heart sounds: Normal heart sounds. No murmur heard.    No friction rub. No gallop.  Pulmonary:     Effort: Pulmonary effort is normal. No respiratory distress.     Breath sounds: Normal breath sounds. No wheezing or rales.  Chest:     Chest wall: No tenderness.  Abdominal:     General: Bowel sounds are normal. There is no distension.     Palpations: Abdomen is soft. There is no mass.     Tenderness: There is no abdominal  tenderness. There is no guarding or rebound.  Genitourinary:    Penis: Normal. No tenderness.      Testes: Normal.     Rectum: Guaiac result negative.     Comments: Prostate is mildly enlarged but smooth and not tender. He has several external hemorrhoids.  Musculoskeletal:        General: No tenderness. Normal range of motion.     Cervical back: Neck supple.  Lymphadenopathy:     Cervical: No cervical adenopathy.  Skin:    General: Skin is warm and dry.     Coloration: Skin is not pale.     Findings: No erythema or rash.  Neurological:     General: No focal deficit present.     Mental Status: He is alert and oriented to person, place, and time.     Cranial Nerves: No cranial nerve deficit.     Motor: No abnormal muscle tone.     Coordination: Coordination normal.     Deep Tendon Reflexes: Reflexes are normal and symmetric. Reflexes normal.  Psychiatric:        Behavior: Behavior normal.  Thought Content: Thought content normal.        Judgment: Judgment normal.           Assessment & Plan:  His OA is stable. His ED is stable. His BPH is starting to causes symptoms, so he will try OTC saw palmetto. Get labs today for a PSA, lipids, etc. We will contact the GI office to arrange another colonoscopy for him. We spent a total of (34   ) minutes reviewing records and discussing these issues.  Alysia Penna, MD

## 2022-07-04 ENCOUNTER — Ambulatory Visit (AMBULATORY_SURGERY_CENTER): Payer: Medicare HMO | Admitting: *Deleted

## 2022-07-04 ENCOUNTER — Encounter: Payer: Self-pay | Admitting: Gastroenterology

## 2022-07-04 VITALS — Ht 70.5 in | Wt 175.0 lb

## 2022-07-04 DIAGNOSIS — Z1211 Encounter for screening for malignant neoplasm of colon: Secondary | ICD-10-CM

## 2022-07-04 MED ORDER — NA SULFATE-K SULFATE-MG SULF 17.5-3.13-1.6 GM/177ML PO SOLN: 1.0000 | Freq: Once | ORAL | 0 refills | Status: AC

## 2022-07-04 NOTE — Progress Notes (Signed)
No egg or soy allergy known to patient  No issues known to pt with past sedation with any surgeries or procedures Patient denies ever being told they had issues or difficulty with intubation  No FH of Malignant Hyperthermia Pt is not on diet pills Pt is not on  home 02  Pt is not on blood thinners  Pt denies issues with constipation  Pt is not on dialysis Pt denies any upcoming cardiac testing Pt encouraged to use to use Singlecare or Goodrx to reduce cost Patient's chart reviewed by Osvaldo Angst CNRA prior to previsit and patient appropriate for the [facility].  Previsit completed and red dot placed by patient's name on their procedure day (on provider's schedule).  . Visit by phone Instructions reviewed with pt and pt states understanding. Instructed to review again prior to procedure. Pt states they will.  Instructions sent to my chart and by mail with coupon

## 2022-07-24 ENCOUNTER — Ambulatory Visit (AMBULATORY_SURGERY_CENTER): Payer: Medicare HMO | Admitting: Gastroenterology

## 2022-07-24 ENCOUNTER — Encounter: Payer: Self-pay | Admitting: Gastroenterology

## 2022-07-24 VITALS — BP 153/91 | HR 66 | Temp 97.7°F | Resp 14 | Ht 70.25 in | Wt 175.0 lb

## 2022-07-24 DIAGNOSIS — D128 Benign neoplasm of rectum: Secondary | ICD-10-CM

## 2022-07-24 DIAGNOSIS — D120 Benign neoplasm of cecum: Secondary | ICD-10-CM

## 2022-07-24 DIAGNOSIS — D122 Benign neoplasm of ascending colon: Secondary | ICD-10-CM | POA: Diagnosis not present

## 2022-07-24 DIAGNOSIS — Z09 Encounter for follow-up examination after completed treatment for conditions other than malignant neoplasm: Secondary | ICD-10-CM

## 2022-07-24 DIAGNOSIS — D124 Benign neoplasm of descending colon: Secondary | ICD-10-CM

## 2022-07-24 DIAGNOSIS — Z86010 Personal history of colonic polyps: Secondary | ICD-10-CM

## 2022-07-24 DIAGNOSIS — D123 Benign neoplasm of transverse colon: Secondary | ICD-10-CM

## 2022-07-24 DIAGNOSIS — Z1211 Encounter for screening for malignant neoplasm of colon: Secondary | ICD-10-CM

## 2022-07-24 DIAGNOSIS — K635 Polyp of colon: Secondary | ICD-10-CM | POA: Diagnosis not present

## 2022-07-24 MED ORDER — SODIUM CHLORIDE 0.9 % IV SOLN: 500.0000 mL | Freq: Once | INTRAVENOUS | Status: DC

## 2022-07-24 NOTE — Progress Notes (Unsigned)
GASTROENTEROLOGY PROCEDURE H&P NOTE   Primary Care Physician: Laurey Morale, MD  HPI: John Trujillo is a 77 y.o. male who presents for Colonoscopy for screening.  Past Medical History:  Diagnosis Date   Arthritis    in knees, has seen Dr. Wynelle Link    Coronary artery, anomalous origin 1995   anomalous artery off the LAD by cath pt. denies   Dyslipidemia    ED (erectile dysfunction)    History of hemorrhoids    Peyronie's disease    Prostatitis    Pyelonephritis    history of urinary retention   Tubulovillous adenoma of colon    Past Surgical History:  Procedure Laterality Date   COLONOSCOPY  06-04-14   per Dr. 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     sildenafil (VIAGRA) 100 MG tablet Take 1 tablet (100 mg total) by mouth daily as needed for erectile dysfunction. 10 tablet 11   Current Facility-Administered Medications  Medication Dose Route Frequency Provider Last Rate Last Admin   0.9 %  sodium chloride infusion  500 mL Intravenous Once Mansouraty, Telford Nab., MD        Current Outpatient Medications:    Multiple Vitamin (MULTIVITAMIN ADULT PO), Take by mouth., Disp: , Rfl:    sildenafil (VIAGRA) 100 MG tablet, Take 1 tablet (100 mg total) by mouth daily as needed for erectile dysfunction., Disp:  10 tablet, Rfl: 11  Current Facility-Administered Medications:    0.9 %  sodium chloride infusion, 500 mL, Intravenous, Once, Mansouraty, Telford Nab., MD No Known Allergies Family History  Problem Relation Age of Onset   Cancer Father        Prostate cancer   Colon cancer Neg Hx    Rectal cancer Neg Hx    Stomach cancer Neg Hx    Colon polyps Neg Hx    Esophageal cancer Neg Hx    Social History   Socioeconomic History   Marital status: Married    Spouse name: Not on file   Number of children: 2   Years of education: 63   Highest education level: Not on file  Occupational History   Occupation: executive    Comment: retired  Tobacco Use   Smoking status: Never   Smokeless tobacco: Never  Vaping Use   Vaping Use: Never used  Substance and Sexual Activity   Alcohol use: Yes    Alcohol/week: 14.0 standard drinks of alcohol    Types: 14 Standard drinks or equivalent per week    Comment: 2 glasses of wine each night   Drug use: No   Sexual activity: Yes    Partners: Female  Other Topics Concern   Not on file  Social History Narrative   [employer],  [employer] [place] and  MBA. [employer]. 1 son- '83 (lives at home); 1 Daughter- '80-married, PhD. Work - Retired Programme researcher, broadcasting/film/video from [place] '06 enjoys retirement and exercise.Wife -  Had aortic valve and root replacement '13, suffered MI post-op, had 45 min open heart massage followed by CABG. She has to have two stents placed by Liam Rogers '14. She is making a good recovery. marriage is in good health.            Social Determinants of Health   Financial Resource Strain: Low Risk  (09/13/2021)   Overall Financial Resource Strain (CARDIA)    Difficulty of Paying Living Expenses: Not hard at all  Food Insecurity: No Food Insecurity (09/13/2021)   Hunger Vital Sign    Worried About Running Out of Food in the Last Year: Never true    Ran Out of Food in the Last Year: Never true  Transportation Needs: No Transportation Needs  (09/13/2021)   PRAPARE - Hydrologist (Medical): No    Lack of Transportation (Non-Medical): No  Physical Activity: Sufficiently Active (09/13/2021)   Exercise Vital Sign    Days of Exercise per Week: 5 days    Minutes of Exercise per Session: 50 min  Stress: No Stress Concern Present (09/13/2021)   [place]    Feeling of Stress : Not at all  Social Connections: [place] (09/13/2021)   Social Connection and Isolation Panel [NHANES]    Frequency of Communication with Friends and Family: More than three times a week    Frequency of Social Gatherings with Friends and Family: More than three times a week    Attends Religious Services: More than 4 times per year    Active Member of [employer] or Organizations: Yes    Attends Archivist Meetings: More than 4 times per year    Marital Status: Married  Human resources officer Violence: Not At Risk (09/13/2021)   Humiliation, Afraid, Rape, and Kick questionnaire    Fear of Current or Ex-Partner: No    Emotionally Abused: No    Physically Abused: No    Sexually Abused: No    Physical Exam: Today's Vitals   07/24/22 1443 07/24/22 1447  BP: 139/75   Pulse: 82   Temp: 97.7 F (36.5 C) 97.7 F (36.5 C)  TempSrc: Other (Comment)   SpO2: 97%   Weight: 175 lb (79.4 kg)   Height: 5' 10.25" (1.784 m)    Body mass index is 24.93 kg/m. GEN: NAD EYE: Sclerae anicteric ENT: MMM CV: Non-tachycardic GI: Soft, NT/ND NEURO:  Alert & Oriented x 3  Lab Results: No results for input(s): "WBC", "HGB", "HCT", "PLT" in the last 72 hours. BMET No results for input(s): "NA", "K", "CL", "CO2", "GLUCOSE", "BUN", "CREATININE", "CALCIUM" in the last 72 hours. LFT No results for input(s): "PROT", "ALBUMIN", "AST", "ALT", "ALKPHOS", "BILITOT", "BILIDIR", "IBILI" in the last 72 hours. PT/INR No results for input(s): "LABPROT", "INR" in the last 72  hours.   Impression / Plan: This is a 76 y.o.male who presents for Colonoscopy for screening.   The risks and benefits of endoscopic evaluation/treatment were discussed with the patient and/or family; these include but are not limited to the risk of perforation, infection, bleeding, missed lesions, lack of diagnosis, severe illness requiring hospitalization, as well as anesthesia and sedation related illnesses.  The patient's history has been reviewed, patient examined, no change in status, and deemed stable for procedure.  The patient and/or family  is agreeable to proceed.    Justice Britain, MD [facility] Office # [phone]

## 2022-07-24 NOTE — Op Note (Signed)
[facility] Patient Name: John Trujillo Procedure Date: 07/24/2022 3:31 PM MRN: [id number] Endoscopist: Justice Britain , MD, [id number] Age: 77 Referring MD:  Date of Birth: Dec 28, 1945 Gender: Male Account #: [id number] Procedure:                Colonoscopy Indications:              Surveillance: Personal history of adenomatous                            polyps on last colonoscopy > 5 years ago Medicines:                Monitored Anesthesia Care Procedure:                Pre-Anesthesia Assessment:                           - Prior to the procedure, a History and Physical                            was performed, and patient medications and                            allergies were reviewed. The patient's tolerance of                            previous anesthesia was also reviewed. The risks                            and benefits of the procedure and the sedation                            options and risks were discussed with the patient.                            All questions were answered, and informed consent                            was obtained. Prior Anticoagulants: The patient has                            taken no anticoagulant or antiplatelet agents. ASA                            Grade Assessment: III - A patient with severe                            systemic disease. After reviewing the risks and                            benefits, the patient was deemed in satisfactory                            condition to undergo the procedure.  After obtaining informed consent, the colonoscope                            was passed under direct vision. Throughout the                            procedure, the patient's blood pressure, pulse, and                            oxygen saturations were monitored continuously. The                            CF [id number] was introduced through the anus                            and  advanced to the the cecum, identified by                            appendiceal orifice and ileocecal valve. The                            colonoscopy was performed without difficulty. The                            patient tolerated the procedure. The quality of the                            bowel preparation was adequate. The terminal ileum,                            ileocecal valve, appendiceal orifice, and rectum                            were photographed. Scope In: 3:41:38 PM Scope Out: 4:03:40 PM Scope Withdrawal Time: 0 hours 19 minutes 36 seconds  Total Procedure Duration: 0 hours 22 minutes 2 seconds  Findings:                 The digital rectal exam findings include                            hemorrhoids.                           The terminal ileum and ileocecal valve appeared                            normal.                           Ten sessile polyps were found in the rectum (1),                            descending colon (1), transverse colon (4),  ascending colon (2) and cecum (2). The polyps were                            1 to 10 mm in size. These polyps were removed with                            a cold snare. Resection and retrieval were complete.                           Multiple small-mouthed diverticula were found in                            the recto-sigmoid colon and sigmoid colon.                           Normal mucosa was found in the entire colon                            otherwise.                           Non-bleeding non-thrombosed prolapsed external and                            internal hemorrhoids were found during                            retroflexion, during perianal exam and during                            digital exam. The hemorrhoids were Grade IV                            (internal hemorrhoids that prolapse and cannot be                            reduced manually). Complications:            No  immediate complications. Estimated Blood Loss:     Estimated blood loss was minimal. Impression:               - Hemorrhoids found on digital rectal exam.                           - The examined portion of the ileum was normal.                           - Ten, 1 to 10 mm polyps in the rectum, in the                            descending colon, in the transverse colon, in the                            ascending colon and in the cecum, removed with a  cold snare. Resected and retrieved.                           - Diverticulosis in the recto-sigmoid colon and in                            the sigmoid colon.                           - Normal mucosa in the entire examined colon                            otherwise.                           - Non-bleeding non-thrombosed prolapsed external                            and internal hemorrhoids. Recommendation:           - The patient will be observed post-procedure,                            until all discharge criteria are met.                           - Discharge patient to home.                           - Patient has a contact number available for                            emergencies. The signs and symptoms of potential                            delayed complications were discussed with the                            patient. Return to normal activities tomorrow.                            Written discharge instructions were provided to the                            patient.                           - High fiber diet.                           - Use FiberCon 1-2 tablets PO daily.                           - For prolapsed hemorrhoids, recommend Preparation                            H cream versus Anusol suppositories versus Calmol-4  suppositories, pending discussion with patient.                           - Await pathology results.                           - Repeat colonoscopy  for surveillance based on                            pathology results. If all 10 polyps return is                            adenomas, then patient would be recommended a 1                            year follow-up colonoscopy. If less than 10 of them                            return as adenomas, then 3-year follow-up would be                            recommended. Based on the patient's age he would be                            either 33 or 46 at that time, we would have to have                            discussion with the patient to see if we would                            continue colon polyp surveillance/colon cancer                            screening.                           - The findings and recommendations were discussed                            with the patient.                           - The findings and recommendations were discussed                            with the patient's family. Justice Britain, MD 07/24/2022 4:12:03 PM

## 2022-07-24 NOTE — Progress Notes (Unsigned)
Sedate, gd SR, tolerated procedure well, VSS, report to RN 

## 2022-07-24 NOTE — Progress Notes (Signed)
Called to room to assist during endoscopic procedure.  Patient ID and intended procedure confirmed with present staff. Received instructions for my participation in the procedure from the performing physician.  

## 2022-07-24 NOTE — Patient Instructions (Signed)
Discharge instructions given. Handouts on polyps,diverticulosis and hemorrhoids. Resume previous medications. See Recommendations on report. YOU HAD AN ENDOSCOPIC PROCEDURE TODAY AT THE [facility]:   Refer to the procedure report that was given to you for any specific questions about what was found during the examination.  If the procedure report does not answer your questions, please call your gastroenterologist to clarify.  If you requested that your care partner not be given the details of your procedure findings, then the procedure report has been included in a sealed envelope for you to review at your convenience later.  YOU SHOULD EXPECT: Some feelings of bloating in the abdomen. Passage of more gas than usual.  Walking can help get rid of the air that was put into your GI tract during the procedure and reduce the bloating. If you had a lower endoscopy (such as a colonoscopy or flexible sigmoidoscopy) you may notice spotting of blood in your stool or on the toilet paper. If you underwent a bowel prep for your procedure, you may not have a normal bowel movement for a few days.  Please Note:  You might notice some irritation and congestion in your nose or some drainage.  This is from the oxygen used during your procedure.  There is no need for concern and it should clear up in a day or so.  SYMPTOMS TO REPORT IMMEDIATELY:  Following lower endoscopy (colonoscopy or flexible sigmoidoscopy):  Excessive amounts of blood in the stool  Significant tenderness or worsening of abdominal pains  Swelling of the abdomen that is new, acute  Fever of 100F or higher  For urgent or emergent issues, a gastroenterologist can be reached at any hour by calling [phone]. Do not use MyChart messaging for urgent concerns.    DIET:  We do recommend a small meal at first, but then you may proceed to your regular diet.  Drink plenty of fluids but you should avoid alcoholic beverages for 24  hours.  ACTIVITY:  You should plan to take it easy for the rest of today and you should NOT DRIVE or use heavy machinery until tomorrow (because of the sedation medicines used during the test).    FOLLOW UP: Our staff will call the number listed on your records the next business day following your procedure.  We will call around 7:15- 8:00 am to check on you and address any questions or concerns that you may have regarding the information given to you following your procedure. If we do not reach you, we will leave a message.     If any biopsies were taken you will be contacted by phone or by letter within the next 1-3 weeks.  Please call us at [phone] if you have not heard about the biopsies in 3 weeks.    SIGNATURES/CONFIDENTIALITY: You and/or your care partner have signed paperwork which will be entered into your electronic medical record.  These signatures attest to the fact that that the information above on your After Visit Summary has been reviewed and is understood.  Full responsibility of the confidentiality of this discharge information lies with you and/or your care-partner.  

## 2022-07-24 NOTE — Progress Notes (Signed)
Pt's states no medical or surgical changes since previsit or office visit. 

## 2022-07-25 ENCOUNTER — Telehealth: Payer: Self-pay

## 2022-07-25 NOTE — Telephone Encounter (Signed)
  Follow up Call-     07/24/2022    2:47 PM  Call back number  Post procedure Call Back phone  # [phone]  Permission to leave phone message Yes     Patient questions:  Do you have a fever, pain , or abdominal swelling? No. Pain Score  0 *  Have you tolerated food without any problems? Yes.    Have you been able to return to your normal activities? Yes.    Do you have any questions about your discharge instructions: Diet   No. Medications  No. Follow up visit  No.  Do you have questions or concerns about your Care? No.  Actions: * If pain score is 4 or above: No action needed, pain <4.

## 2022-08-03 ENCOUNTER — Encounter: Payer: Self-pay | Admitting: Gastroenterology

## 2022-12-17 ENCOUNTER — Ambulatory Visit (INDEPENDENT_AMBULATORY_CARE_PROVIDER_SITE_OTHER): Payer: Medicare HMO

## 2022-12-17 VITALS — Ht 70.0 in | Wt 176.0 lb

## 2022-12-17 DIAGNOSIS — Z0000 Encounter for general adult medical examination without abnormal findings: Secondary | ICD-10-CM | POA: Diagnosis not present

## 2022-12-17 NOTE — Patient Instructions (Addendum)
John Trujillo , Thank you for taking time to come for your Medicare Wellness Visit. I appreciate your ongoing commitment to your health goals. Please review the following plan we discussed and let me know if I can assist you in the future.   Referrals/Orders/Follow-Ups/Clinician Recommendations:   This is a list of the screening recommended for you and due dates:  Health Maintenance  Topic Date Due   Pneumonia Vaccine (3 of 3 - PPSV23 or PCV20) 08/02/2020   Flu Shot  11/29/2022   Colon Cancer Screening  07/24/2023   Medicare Annual Wellness Visit  12/17/2023   Hepatitis C Screening  Completed   Zoster (Shingles) Vaccine  Completed   HPV Vaccine  Aged Out   DTaP/Tdap/Td vaccine  Discontinued   COVID-19 Vaccine  Discontinued    Advanced directives: (Copy Requested) Please bring a copy of your health care power of attorney and living will to the office to be added to your chart at your convenience.  Next Medicare Annual Wellness Visit scheduled for next year: Yes  Preventive Care 76 Years and Older, Male  Preventive care refers to lifestyle choices and visits with your health care provider that can promote health and wellness. What does preventive care include? A yearly physical exam. This is also called an annual well check. Dental exams once or twice a year. Routine eye exams. Ask your health care provider how often you should have your eyes checked. Personal lifestyle choices, including: Daily care of your teeth and gums. Regular physical activity. Eating a healthy diet. Avoiding tobacco and drug use. Limiting alcohol use. Practicing safe sex. Taking low doses of aspirin every day. Taking vitamin and mineral supplements as recommended by your health care provider. What happens during an annual well check? The services and screenings done by your health care provider during your annual well check will depend on your age, overall health, lifestyle risk factors, and family  history of disease. Counseling  Your health care provider may ask you questions about your: Alcohol use. Tobacco use. Drug use. Emotional well-being. Home and relationship well-being. Sexual activity. Eating habits. History of falls. Memory and ability to understand (cognition). Work and work Astronomer. Screening  You may have the following tests or measurements: Height, weight, and BMI. Blood pressure. Lipid and cholesterol levels. These may be checked every 5 years, or more frequently if you are over 70 years old. Skin check. Lung cancer screening. You may have this screening every year starting at age 26 if you have a 30-pack-year history of smoking and currently smoke or have quit within the past 15 years. Fecal occult blood test (FOBT) of the stool. You may have this test every year starting at age 54. Flexible sigmoidoscopy or colonoscopy. You may have a sigmoidoscopy every 5 years or a colonoscopy every 10 years starting at age 58. Prostate cancer screening. Recommendations will vary depending on your family history and other risks. Hepatitis C blood test. Hepatitis B blood test. Sexually transmitted disease (STD) testing. Diabetes screening. This is done by checking your blood sugar (glucose) after you have not eaten for a while (fasting). You may have this done every 1-3 years. Abdominal aortic aneurysm (AAA) screening. You may need this if you are a current or former smoker. Osteoporosis. You may be screened starting at age 58 if you are at high risk. Talk with your health care provider about your test results, treatment options, and if necessary, the need for more tests. Vaccines  Your health care provider  may recommend certain vaccines, such as: Influenza vaccine. This is recommended every year. Tetanus, diphtheria, and acellular pertussis (Tdap, Td) vaccine. You may need a Td booster every 10 years. Zoster vaccine. You may need this after age 35. Pneumococcal  13-valent conjugate (PCV13) vaccine. One dose is recommended after age 48. Pneumococcal polysaccharide (PPSV23) vaccine. One dose is recommended after age 110. Talk to your health care provider about which screenings and vaccines you need and how often you need them. This information is not intended to replace advice given to you by your health care provider. Make sure you discuss any questions you have with your health care provider. Document Released: 05/13/2015 Document Revised: 01/04/2016 Document Reviewed: 02/15/2015 Elsevier Interactive Patient Education  2017 [employer].  Fall Prevention in the Home Falls can cause injuries. They can happen to people of all ages. There are many things you can do to make your home safe and to help prevent falls. What can I do on the outside of my home? Regularly fix the edges of walkways and driveways and fix any cracks. Remove anything that might make you trip as you walk through a door, such as a raised step or threshold. Trim any bushes or trees on the path to your home. Use bright outdoor lighting. Clear any walking paths of anything that might make someone trip, such as rocks or tools. Regularly check to see if handrails are loose or broken. Make sure that both sides of any steps have handrails. Any raised decks and porches should have guardrails on the edges. Have any leaves, snow, or ice cleared regularly. Use sand or salt on walking paths during winter. Clean up any spills in your garage right away. This includes oil or grease spills. What can I do in the bathroom? Use night lights. Install grab bars by the toilet and in the tub and shower. Do not use towel bars as grab bars. Use non-skid mats or decals in the tub or shower. If you need to sit down in the shower, use a plastic, non-slip stool. Keep the floor dry. Clean up any water that spills on the floor as soon as it happens. Remove soap buildup in the tub or shower regularly. Attach  bath mats securely with double-sided non-slip rug tape. Do not have throw rugs and other things on the floor that can make you trip. What can I do in the bedroom? Use night lights. Make sure that you have a light by your bed that is easy to reach. Do not use any sheets or blankets that are too big for your bed. They should not hang down onto the floor. Have a firm chair that has side arms. You can use this for support while you get dressed. Do not have throw rugs and other things on the floor that can make you trip. What can I do in the kitchen? Clean up any spills right away. Avoid walking on wet floors. Keep items that you use a lot in easy-to-reach places. If you need to reach something above you, use a strong step stool that has a grab bar. Keep electrical cords out of the way. Do not use floor polish or wax that makes floors slippery. If you must use wax, use non-skid floor wax. Do not have throw rugs and other things on the floor that can make you trip. What can I do with my stairs? Do not leave any items on the stairs. Make sure that there are handrails on both sides  of the stairs and use them. Fix handrails that are broken or loose. Make sure that handrails are as long as the stairways. Check any carpeting to make sure that it is firmly attached to the stairs. Fix any carpet that is loose or worn. Avoid having throw rugs at the top or bottom of the stairs. If you do have throw rugs, attach them to the floor with carpet tape. Make sure that you have a light switch at the top of the stairs and the bottom of the stairs. If you do not have them, ask someone to add them for you. What else can I do to help prevent falls? Wear shoes that: Do not have high heels. Have rubber bottoms. Are comfortable and fit you well. Are closed at the toe. Do not wear sandals. If you use a stepladder: Make sure that it is fully opened. Do not climb a closed stepladder. Make sure that both sides of the  stepladder are locked into place. Ask someone to hold it for you, if possible. Clearly mark and make sure that you can see: Any grab bars or handrails. First and last steps. Where the edge of each step is. Use tools that help you move around (mobility aids) if they are needed. These include: Canes. Walkers. Scooters. Crutches. Turn on the lights when you go into a dark area. Replace any light bulbs as soon as they burn out. Set up your furniture so you have a clear path. Avoid moving your furniture around. If any of your floors are uneven, fix them. If there are any pets around you, be aware of where they are. Review your medicines with your doctor. Some medicines can make you feel dizzy. This can increase your chance of falling. Ask your doctor what other things that you can do to help prevent falls. This information is not intended to replace advice given to you by your health care provider. Make sure you discuss any questions you have with your health care provider. Document Released: 02/10/2009 Document Revised: 09/22/2015 Document Reviewed: 05/21/2014 Elsevier Interactive Patient Education  2017 [employer].

## 2022-12-17 NOTE — Progress Notes (Cosign Needed Addendum)
Subjective:   John Trujillo is a 77 y.o. male who presents for Medicare Annual/Subsequent preventive examination.  Visit Complete: Virtual  I connected with  [name] on 12/17/22 by a audio enabled telemedicine application and verified that I am speaking with the correct person using two identifiers.  Patient Location: Home  Provider Location: Home Office  I discussed the limitations of evaluation and management by telemedicine. The patient expressed understanding and agreed to proceed.  Patient Medicare AWV questionnaire was completed by the patient on 12/16/22; I have confirmed that all information answered by patient is correct and no changes since this date.  Review of Systems    Vital Signs: Unable to obtain new vitals due to this being a telehealth visit.  Cardiac Risk Factors include: advanced age (>59men, >41 women);male gender;dyslipidemia     Objective:    Today's Vitals   12/17/22 1022  Weight: 176 lb (79.8 kg)  Height: 5\' 10"  (1.778 m)   Body mass index is 25.25 kg/m.     12/17/2022   10:52 AM 09/13/2021    1:13 PM 04/17/2019    9:19 AM 05/07/2017    1:54 PM 05/02/2017   10:25 AM 03/26/2017    5:29 PM 03/20/2017   10:02 AM  Advanced Directives  Does Patient Have a Medical Advance Directive? Yes Yes Yes Yes Yes Yes Yes  Type of Estate agent of [place];Living will Healthcare Power of [place];Living will Healthcare Power of [place];Living will Living will Living will Living will;Healthcare Power of Attorney Living will;Healthcare Power of Attorney  Does patient want to make changes to medical advance directive?  No - Patient declined No - Patient declined No - Patient declined No - Patient declined No - Patient declined No - Patient declined  Copy of Healthcare Power of Attorney in Chart? No - copy requested No - copy requested No - copy requested   No - copy requested     Current Medications (verified) Outpatient Encounter  Medications as of 12/17/2022  Medication Sig   Multiple Vitamin (MULTIVITAMIN ADULT PO) Take by mouth.   sildenafil (VIAGRA) 100 MG tablet Take 1 tablet (100 mg total) by mouth daily as needed for erectile dysfunction.   No facility-administered encounter medications on file as of 12/17/2022.    Allergies (verified) Patient has no known allergies.   History: Past Medical History:  Diagnosis Date   Arthritis    in knees, has seen Dr. Lequita Halt    Coronary artery, anomalous origin 1995   anomalous artery off the LAD by cath pt. denies   Dyslipidemia    ED (erectile dysfunction)    History of hemorrhoids    Peyronie's disease    Prostatitis    Pyelonephritis    history of urinary retention   Tubulovillous adenoma of colon    Past Surgical History:  Procedure Laterality Date   COLONOSCOPY  06-04-14   per Dr. 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   Procedure: RIGHT TOTAL HIP ARTHROPLASTY ANTERIOR APPROACH;  Surgeon: Durene Romans, MD;  Location: [facility];  Service: Orthopedics;  Laterality: Right;  General   TOTAL HIP ARTHROPLASTY Left 05/07/2017   Procedure: LEFT TOTAL HIP ARTHROPLASTY ANTERIOR APPROACH;  Surgeon: Durene Romans, MD;  Location: [facility];  Service: Orthopedics;  Laterality: Left;  70 mins   Family History  Problem Relation Age of Onset   Cancer Father        Prostate cancer   Colon cancer Neg Hx    Rectal cancer Neg Hx    Stomach cancer Neg Hx    Colon polyps Neg Hx    Esophageal cancer Neg Hx    Social History   Socioeconomic History   Marital status: Married    Spouse name: Not on file   Number of children: 2   Years of education: 18   Highest education level: Not on file  Occupational History   Occupation: executive    Comment: retired  Tobacco Use   Smoking  status: Never   Smokeless tobacco: Never  Vaping Use   Vaping status: Never Used  Substance and Sexual Activity   Alcohol use: Yes    Alcohol/week: 14.0 standard drinks of alcohol    Types: 14 Standard drinks or equivalent per week    Comment: 2 glasses of wine each night   Drug use: No   Sexual activity: Yes    Partners: Female  Other Topics Concern   Not on file  Social History Narrative   [employer],  BA- business and [employer]. Married-'76. 1 son- '83 (lives at home); 1 Daughter- '80-married, PhD. 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 She has to have two stents placed by Delane Ginger '14. She is making a good recovery. marriage is in good health.            Social Determinants of Health   Financial Resource Strain: Low Risk  (12/17/2022)   Overall Financial Resource Strain (CARDIA)    Difficulty of Paying Living Expenses: Not hard at all  Food Insecurity: No Food Insecurity (12/17/2022)   Hunger Vital Sign    Worried About Running Out of Food in the Last Year: Never true    Ran Out of Food in the Last Year: Never true  Transportation Needs: No Transportation Needs (12/17/2022)   PRAPARE - Administrator, Civil Service (Medical): No    Lack of Transportation (Non-Medical): No  Physical Activity: Sufficiently Active (12/17/2022)   Exercise Vital Sign    Days of Exercise per Week: 7 days    Minutes of Exercise per Session: 60 min  Stress: No Stress Concern Present (12/17/2022)   [employer] of Occupational Health - Occupational Stress Questionnaire    Feeling of Stress : Not at all  Social Connections: Unknown (12/17/2022)   Social Connection and Isolation Panel [NHANES]    Frequency of Communication with Friends and Family: More than three times a week    Frequency of Social Gatherings with Friends and Family:  More than three times a week    Attends Religious Services: Not on Marketing executive or Organizations: Yes    Attends Engineer, structural: More than 4 times per year    Marital Status: Married    Tobacco Counseling Counseling given: Not Answered   Clinical Intake:  Pre-visit preparation completed: Yes  Pain : No/denies pain     BMI - recorded: 25.25 Nutritional Status: BMI 25 -29 Overweight Nutritional Risks: None Diabetes: No  How often do you need to have someone help you when you  read instructions, pamphlets, or other written materials from your doctor or pharmacy?: 1 - Never  Interpreter Needed?: No  Information entered by :: Theresa Mulligan LPN   Activities of Daily Living    12/17/2022    7:25 AM 09/12/2022    9:03 AM  In your present state of health, do you have any difficulty performing the following activities:  Hearing? 0 0  Vision? 0 0  Difficulty concentrating or making decisions? 0 0  Walking or climbing stairs? 0 0  Dressing or bathing? 0 0  Doing errands, shopping? 0 0  Preparing Food and eating ? N N  Using the Toilet? N N  In the past six months, have you accidently leaked urine? N N  Do you have problems with loss of bowel control? N N  Managing your Medications? N N  Managing your Finances? N N  Housekeeping or managing your Housekeeping? N N    Patient Care Team: Nelwyn Salisbury, MD as PCP - General (Family Medicine)  Indicate any recent Medical Services you may have received from other than [facility] providers in the past year (date may be approximate).     Assessment:   This is a routine wellness examination for Rayshon.  Hearing/Vision screen Hearing Screening - Comments:: Denies hearing difficulties   Vision Screening - Comments:: Wears rx glasses - up to date with routine eye exams with  Maggie Schwalbe  Dietary issues and exercise activities discussed:     Goals Addressed               This Visit's  Progress     Increase physical activity (pt-stated)        Specifically increase intensity.       Depression Screen    12/17/2022   10:27 AM 05/30/2022   11:17 AM 09/13/2021    1:09 PM 04/17/2021    2:07 PM 04/17/2019    9:20 AM 07/10/2016    4:16 PM 07/11/2015   10:48 AM  PHQ 2/9 Scores  PHQ - 2 Score 0 0 0 0 0 0 0  PHQ- 9 Score  0  0       Fall Risk    12/17/2022    7:25 AM 09/12/2022    9:03 AM 05/30/2022   11:16 AM 09/13/2021    1:12 PM 09/11/2021    2:53 PM  Fall Risk   Falls in the past year? 0 0 0 0 0  Number falls in past yr: 0 0 0 0   Injury with Fall? 0 0 0 0   Risk for fall due to : No Fall Risks  No Fall Risks No Fall Risks   Follow up Falls prevention discussed  Falls evaluation completed      MEDICARE RISK AT HOME: Medicare Risk at Home Any stairs in or around the home?: Yes If so, are there any without handrails?: No Home free of loose throw rugs in walkways, pet beds, electrical cords, etc?: Yes Adequate lighting in your home to reduce risk of falls?: Yes Life alert?: No Use of a cane, walker or w/c?: No Grab bars in the bathroom?: No Shower chair or bench in shower?: No Elevated toilet seat or a handicapped toilet?: No  TIMED UP AND GO:  Was the test performed?  No    Cognitive Function:        12/17/2022   10:51 AM 09/13/2021    1:13 PM 04/17/2019    9:25 AM  6CIT Screen  What Year? 0 points 0 points 0 points  What month? 0 points 0 points 0 points  What time? 0 points 0 points 0 points  Count back from 20 0 points 0 points 0 points  Months in reverse 0 points 0 points 0 points  Repeat phrase 0 points 0 points 0 points  Total Score 0 points 0 points 0 points    Immunizations Immunization History  Administered Date(s) Administered   Fluad Quad(high Dose 65+) 01/15/2019   Influenza, High Dose Seasonal PF 03/17/2013, 02/03/2018   Influenza-Unspecified 03/31/2015, 02/23/2016, 02/05/2017, 02/15/2021, 02/16/2022   PFIZER(Purple  Top)SARS-COV-2 Vaccination 05/20/2019, 06/10/2019   Pneumococcal Conjugate-13 08/03/2015   Pneumococcal Polysaccharide-23 01/14/2009   Td 01/14/2009   Unspecified SARS-COV-2 Vaccination 02/15/2021, 02/16/2022   Zoster Recombinant(Shingrix) 12/06/2017, 05/02/2018      Flu Vaccine status: Due, Education has been provided regarding the importance of this vaccine. Advised may receive this vaccine at local pharmacy or Health Dept. Aware to provide a copy of the vaccination record if obtained from local pharmacy or Health Dept. Verbalized acceptance and understanding.  Pneumococcal vaccine status: Due, Education has been provided regarding the importance of this vaccine. Advised may receive this vaccine at local pharmacy or Health Dept. Aware to provide a copy of the vaccination record if obtained from local pharmacy or Health Dept. Verbalized acceptance and understanding.    Qualifies for Shingles Vaccine? Yes   Zostavax completed Yes   Shingrix Completed?: Yes  Screening Tests Health Maintenance  Topic Date Due   Pneumonia Vaccine 35+ Years old (3 of 3 - PPSV23 or PCV20) 08/02/2020   INFLUENZA VACCINE  11/29/2022   Colonoscopy  07/24/2023   Medicare Annual Wellness (AWV)  12/17/2023   Hepatitis C Screening  Completed   Zoster Vaccines- Shingrix  Completed   HPV VACCINES  Aged Out   DTaP/Tdap/Td  Discontinued   COVID-19 Vaccine  Discontinued    Health Maintenance  Health Maintenance Due  Topic Date Due   Pneumonia Vaccine 26+ Years old (3 of 3 - PPSV23 or PCV20) 08/02/2020   INFLUENZA VACCINE  11/29/2022    Colorectal cancer screening: Type of screening: Colonoscopy. Completed 07/24/22. Repeat every   years  Lung Cancer Screening: (Low Dose CT Chest recommended if Age 36-80 years, 20 pack-year currently smoking OR have quit w/in 15years.) does not qualify.     Additional Screening:  Hepatitis C Screening: does qualify; Completed 08/03/15  Vision Screening: Recommended  annual ophthalmology exams for early detection of glaucoma and other disorders of the eye. Is the patient up to date with their annual eye exam?  Yes  Who is the provider or what is the name of the office in which the patient attends annual eye exams? [facility] If pt is not established with a provider, would they like to be referred to a provider to establish care? No .   Dental Screening: Recommended annual dental exams for proper oral hygiene    Community Resource Referral / Chronic Care Management:  CRR required this visit?  No   CCM required this visit?  No     Plan:     I have personally reviewed and noted the following in the patient's chart:   Medical and social history Use of alcohol, tobacco or illicit drugs  Current medications and supplements including opioid prescriptions. Patient is not currently taking opioid prescriptions. Functional ability and status Nutritional status Physical activity Advanced directives List of other physicians Hospitalizations, surgeries, and ER visits in previous 12  months Vitals Screenings to include cognitive, depression, and falls Referrals and appointments  In addition, I have reviewed and discussed with patient certain preventive protocols, quality metrics, and best practice recommendations. A written personalized care plan for preventive services as well as general preventive health recommendations were provided to patient.     Tillie Rung, LPN   [id number]   After Visit Summary: (MyChart) Due to this being a telephonic visit, the after visit summary with patients personalized plan was offered to patient via MyChart   Nurse Notes: None

## 2023-01-21 DIAGNOSIS — L821 Other seborrheic keratosis: Secondary | ICD-10-CM | POA: Diagnosis not present

## 2023-01-21 DIAGNOSIS — D2272 Melanocytic nevi of left lower limb, including hip: Secondary | ICD-10-CM | POA: Diagnosis not present

## 2023-01-21 DIAGNOSIS — L570 Actinic keratosis: Secondary | ICD-10-CM | POA: Diagnosis not present

## 2023-01-21 DIAGNOSIS — D225 Melanocytic nevi of trunk: Secondary | ICD-10-CM | POA: Diagnosis not present

## 2023-03-18 DIAGNOSIS — R3912 Poor urinary stream: Secondary | ICD-10-CM | POA: Diagnosis not present

## 2023-03-18 DIAGNOSIS — R3915 Urgency of urination: Secondary | ICD-10-CM | POA: Diagnosis not present

## 2023-03-18 DIAGNOSIS — N3000 Acute cystitis without hematuria: Secondary | ICD-10-CM | POA: Diagnosis not present

## 2023-03-18 DIAGNOSIS — R351 Nocturia: Secondary | ICD-10-CM | POA: Diagnosis not present

## 2023-03-18 DIAGNOSIS — N401 Enlarged prostate with lower urinary tract symptoms: Secondary | ICD-10-CM | POA: Diagnosis not present

## 2023-04-30 DIAGNOSIS — H5213 Myopia, bilateral: Secondary | ICD-10-CM | POA: Diagnosis not present

## 2023-04-30 DIAGNOSIS — H2513 Age-related nuclear cataract, bilateral: Secondary | ICD-10-CM | POA: Diagnosis not present

## 2023-04-30 DIAGNOSIS — H353131 Nonexudative age-related macular degeneration, bilateral, early dry stage: Secondary | ICD-10-CM | POA: Diagnosis not present

## 2023-05-07 ENCOUNTER — Ambulatory Visit (INDEPENDENT_AMBULATORY_CARE_PROVIDER_SITE_OTHER): Payer: Medicare HMO | Admitting: Family Medicine

## 2023-05-07 ENCOUNTER — Encounter: Payer: Self-pay | Admitting: Family Medicine

## 2023-05-07 VITALS — BP 120/78 | HR 52 | Temp 98.0°F | Wt 174.8 lb

## 2023-05-07 DIAGNOSIS — N401 Enlarged prostate with lower urinary tract symptoms: Secondary | ICD-10-CM

## 2023-05-07 DIAGNOSIS — R0989 Other specified symptoms and signs involving the circulatory and respiratory systems: Secondary | ICD-10-CM | POA: Diagnosis not present

## 2023-05-07 DIAGNOSIS — R739 Hyperglycemia, unspecified: Secondary | ICD-10-CM | POA: Diagnosis not present

## 2023-05-07 DIAGNOSIS — J40 Bronchitis, not specified as acute or chronic: Secondary | ICD-10-CM | POA: Diagnosis not present

## 2023-05-07 DIAGNOSIS — N138 Other obstructive and reflux uropathy: Secondary | ICD-10-CM

## 2023-05-07 DIAGNOSIS — E785 Hyperlipidemia, unspecified: Secondary | ICD-10-CM

## 2023-05-07 LAB — BASIC METABOLIC PANEL
BUN: 19 mg/dL (ref 6–23)
CO2: 30 meq/L (ref 19–32)
Calcium: 9.6 mg/dL (ref 8.4–10.5)
Chloride: 102 meq/L (ref 96–112)
Creatinine, Ser: 1 mg/dL (ref 0.40–1.50)
GFR: 73.22 mL/min (ref >60.00)
Glucose, Bld: 88 mg/dL (ref 70–99)
Potassium: 4.9 meq/L (ref 3.5–5.1)
Sodium: 138 meq/L (ref 135–145)

## 2023-05-07 LAB — HEPATIC FUNCTION PANEL
ALT: 23 U/L (ref 0–53)
AST: 20 U/L (ref 0–37)
Albumin: 4.2 g/dL (ref 3.5–5.2)
Alkaline Phosphatase: 76 U/L (ref 39–117)
Bilirubin, Direct: 0.2 mg/dL (ref 0.0–0.3)
Total Bilirubin: 0.7 mg/dL (ref 0.2–1.2)
Total Protein: 6.7 g/dL (ref 6.0–8.3)

## 2023-05-07 LAB — LIPID PANEL
Cholesterol: 178 mg/dL (ref 0–200)
HDL: 55.6 mg/dL (ref >39.00)
LDL Cholesterol: 103 mg/dL — ABNORMAL HIGH (ref 0–99)
NonHDL: 122.64
Total CHOL/HDL Ratio: 3
Triglycerides: 98 mg/dL (ref 0.0–149.0)
VLDL: 19.6 mg/dL (ref 0.0–40.0)

## 2023-05-07 LAB — HEMOGLOBIN A1C: Hgb A1c MFr Bld: 5.1 % (ref 4.6–6.5)

## 2023-05-07 LAB — CBC WITH DIFFERENTIAL/PLATELET
Basophils Absolute: 0.1 10*3/uL (ref 0.0–0.1)
Basophils Relative: 1.2 % (ref 0.0–3.0)
Eosinophils Absolute: 0.3 10*3/uL (ref 0.0–0.7)
Eosinophils Relative: 4.1 % (ref 0.0–5.0)
HCT: 43.4 % (ref 39.0–52.0)
Hemoglobin: 14.8 g/dL (ref 13.0–17.0)
Lymphocytes Relative: 25.2 % (ref 12.0–46.0)
Lymphs Abs: 1.6 10*3/uL (ref 0.7–4.0)
MCHC: 34 g/dL (ref 30.0–36.0)
MCV: 91.4 fL (ref 78.0–100.0)
Monocytes Absolute: 0.5 10*3/uL (ref 0.1–1.0)
Monocytes Relative: 7.7 % (ref 3.0–12.0)
Neutro Abs: 3.8 10*3/uL (ref 1.4–7.7)
Neutrophils Relative %: 61.8 % (ref 43.0–77.0)
Platelets: 334 10*3/uL (ref 150.0–400.0)
RBC: 4.75 Mil/uL (ref 4.22–5.81)
RDW: 14.3 % (ref 11.5–15.5)
WBC: 6.2 10*3/uL (ref 4.0–10.5)

## 2023-05-07 LAB — POCT INFLUENZA A/B
Influenza A, POC: NEGATIVE
Influenza B, POC: NEGATIVE

## 2023-05-07 LAB — POC COVID19 BINAXNOW: SARS Coronavirus 2 Ag: NEGATIVE

## 2023-05-07 LAB — TSH: TSH: 1.57 u[IU]/mL (ref 0.35–5.50)

## 2023-05-07 LAB — PSA: PSA: 3.91 ng/mL (ref 0.10–4.00)

## 2023-05-07 MED ORDER — AMOXICILLIN-POT CLAVULANATE 875-125 MG PO TABS: 1.0000 | ORAL_TABLET | Freq: Two times a day (BID) | ORAL | 0 refills | Status: DC

## 2023-05-07 MED ORDER — HYDROCODONE BIT-HOMATROP MBR 5-1.5 MG/5ML PO SOLN: 5.0000 mL | ORAL | 0 refills | Status: DC | PRN

## 2023-05-07 NOTE — Progress Notes (Signed)
   Subjective:    Patient ID: John Trujillo, male    DOB: [birth year], 78 y.o.   MRN: [id number]  HPI Here for 3 weeks of chest congestion and a dry cough. No fever or SOB.    Review of Systems  Constitutional: Negative.   HENT:  Positive for congestion. Negative for ear pain, postnasal drip, sinus pressure and sore throat.   Eyes: Negative.   Respiratory:  Positive for cough. Negative for shortness of breath and wheezing.        Objective:   Physical Exam Constitutional:      Appearance: Normal appearance.  HENT:     Right Ear: Tympanic membrane, ear canal and external ear normal.     Left Ear: Tympanic membrane, ear canal and external ear normal.     Nose: Nose normal.     Mouth/Throat:     Pharynx: Oropharynx is clear.  Eyes:     Conjunctiva/sclera: Conjunctivae normal.  Pulmonary:     Effort: Pulmonary effort is normal.     Breath sounds: Rhonchi present. No wheezing.  Lymphadenopathy:     Cervical: No cervical adenopathy.  Neurological:     Mental Status: He is alert.           Assessment & Plan:  Bronchitis, treat with 10 days of Augmentin . Use Hycodan as needed for cough.  Garnette Olmsted, MD

## 2023-08-01 ENCOUNTER — Encounter: Payer: Self-pay | Admitting: Adult Health

## 2023-08-01 ENCOUNTER — Ambulatory Visit (INDEPENDENT_AMBULATORY_CARE_PROVIDER_SITE_OTHER): Admitting: Adult Health

## 2023-08-01 VITALS — BP 120/80 | HR 76 | Temp 97.9°F | Ht 70.0 in | Wt 171.0 lb

## 2023-08-01 DIAGNOSIS — J988 Other specified respiratory disorders: Secondary | ICD-10-CM

## 2023-08-01 MED ORDER — FLUTICASONE PROPIONATE 50 MCG/ACT NA SUSP: 2.0000 | Freq: Every day | NASAL | 6 refills | Status: AC

## 2023-08-01 MED ORDER — PREDNISONE 20 MG PO TABS
20.0000 mg | ORAL_TABLET | Freq: Every day | ORAL | 0 refills | Status: AC
Start: 2023-08-01 — End: ?

## 2023-08-01 NOTE — Progress Notes (Signed)
 Subjective:    Patient ID: John Trujillo, male    DOB: [birth year], 78 y.o.   MRN: [id number]  Cough   78 year old male who  has a past medical history of Arthritis, Coronary artery, anomalous origin (1995), Dyslipidemia, ED (erectile dysfunction), History of hemorrhoids, Peyronie's disease, Prostatitis, Pyelonephritis, and Tubulovillous adenoma of colon.  He is a patient of Dr. Clent Ridges who I am seeing today for an acute issue. He reports that for the last week he has been experiencing a productive cough with white sputum, nasal congestion, sinus pressure, and wheezing ( resolved a few days ago). He has not had fevers, chills, SOB, DOE. Today he woke up " feeling a lot better".   His wife and grandchildren have been sick.   At home he has been using Mucinex and cough syrup.    Review of Systems  Respiratory:  Positive for cough.    See HPI   Past Medical History:  Diagnosis Date   Arthritis    in knees, has seen Dr. Lequita Halt    Coronary artery, anomalous origin 1995   anomalous artery off the LAD by cath pt. denies   Dyslipidemia    ED (erectile dysfunction)    History of hemorrhoids    Peyronie's disease    Prostatitis    Pyelonephritis    history of urinary retention   Tubulovillous adenoma of colon     Social History   Socioeconomic History   Marital status: Married    Spouse name: Not on file   Number of children: 2   Years of education: 49   Highest education level: Master's degree (e.g., MA, MS, MEng, MEd, MSW, MBA)  Occupational History   Occupation: executive    Comment: retired  Tobacco Use   Smoking status: Never   Smokeless tobacco: Never  Vaping Use   Vaping status: Never Used  Substance and Sexual Activity   Alcohol use: Yes    Alcohol/week: 14.0 standard drinks of alcohol    Types: 14 Standard drinks or equivalent per week    Comment: 2 glasses of wine each night   Drug use: No   Sexual activity: Yes    Partners: Female  Other Topics Concern    Not on file  Social History Narrative   [employer],  BA- business and [employer]. Married-'76. 1 son- '83 (lives at home); 1 Daughter- '80-married, PhD. 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 She has to have two stents placed by Delane Ginger '14. She is making a good recovery. marriage is in good health.            Social Drivers of Corporate investment banker Strain: Low Risk  (05/06/2023)   Overall Financial Resource Strain (CARDIA)    Difficulty of Paying Living Expenses: Not hard at all  Food Insecurity: No Food Insecurity (05/06/2023)   Hunger Vital Sign    Worried About Running Out of Food in the Last Year: Never true    Ran Out of Food in the Last Year: Never true  Transportation Needs: No Transportation Needs (05/06/2023)   PRAPARE - Administrator, Civil Service (Medical): No    Lack of Transportation (Non-Medical): No  Physical Activity: Sufficiently Active (05/06/2023)   Exercise Vital Sign    Days of Exercise per Week: 5 days  Minutes of Exercise per Session: 50 min  Stress: No Stress Concern Present (05/06/2023)   [employer] of Occupational Health - Occupational Stress Questionnaire    Feeling of Stress : Not at all  Social Connections: Socially Integrated (05/06/2023)   Social Connection and Isolation Panel [NHANES]    Frequency of Communication with Friends and Family: Three times a week    Frequency of Social Gatherings with Friends and Family: Three times a week    Attends Religious Services: 1 to 4 times per year    Active Member of Clubs or Organizations: Yes    Attends Banker Meetings: More than 4 times per year    Marital Status: Married  Catering manager Violence: Not At Risk (12/17/2022)   Humiliation, Afraid, Rape, and Kick questionnaire    Fear of Current or Ex-Partner: No     Emotionally Abused: No    Physically Abused: No    Sexually Abused: No    Past Surgical History:  Procedure Laterality Date   COLONOSCOPY  06-04-14   per Dr. 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   Procedure: RIGHT TOTAL HIP ARTHROPLASTY ANTERIOR APPROACH;  Surgeon: Durene Romans, MD;  Location: [facility];  Service: Orthopedics;  Laterality: Right;  General   TOTAL HIP ARTHROPLASTY Left 05/07/2017   Procedure: LEFT TOTAL HIP ARTHROPLASTY ANTERIOR APPROACH;  Surgeon: Durene Romans, MD;  Location: [facility];  Service: Orthopedics;  Laterality: Left;  70 mins    Family History  Problem Relation Age of Onset   Cancer Father        Prostate cancer   Colon cancer Neg Hx    Rectal cancer Neg Hx    Stomach cancer Neg Hx    Colon polyps Neg Hx    Esophageal cancer Neg Hx     No Known Allergies  Current Outpatient Medications on File Prior to Visit  Medication Sig Dispense Refill   Multiple Vitamin (MULTIVITAMIN ADULT PO) Take by mouth.     tadalafil (CIALIS) 5 MG tablet Take 5 mg by mouth daily as needed for erectile dysfunction.     sildenafil (VIAGRA) 100 MG tablet Take 1 tablet (100 mg total) by mouth daily as needed for erectile dysfunction. 10 tablet 11   No current facility-administered medications on file prior to visit.    BP 120/80   Pulse 76   Temp 97.9 F (36.6 C) (Oral)   Ht 5\' 10"  (1.778 m)   Wt 171 lb (77.6 kg)   SpO2 95%   BMI 24.54 kg/m       Objective:   Physical Exam Vitals and nursing note reviewed.  Constitutional:      Appearance: Normal appearance.  HENT:     Nose: Nose normal. No congestion or rhinorrhea.     Mouth/Throat:     Mouth: Mucous membranes are moist.     Comments: Clear PND Eyes:     Extraocular Movements: Extraocular movements intact.     Pupils:  Pupils are equal, round, and reactive to light.  Cardiovascular:     Rate and Rhythm: Normal rate and regular rhythm.     Pulses: Normal pulses.     Heart sounds: Normal heart sounds.  Pulmonary:     Effort:  Pulmonary effort is normal.     Breath sounds: Normal breath sounds.  Musculoskeletal:        General: Normal range of motion.  Skin:    General: Skin is warm and dry.  Neurological:     General: No focal deficit present.     Mental Status: He is alert and oriented to person, place, and time.  Psychiatric:        Mood and Affect: Mood normal.        Behavior: Behavior normal.        Thought Content: Thought content normal.        Judgment: Judgment normal.        Assessment & Plan:   1. Respiratory infection (Primary) - Viral vs seasonal allergy. No signs of bacterial sinusitis or pneumonia. Will send in prednisone and flonase. Follow up if not improving in the next 2-3 days  - predniSONE (DELTASONE) 20 MG tablet; Take 1 tablet (20 mg total) by mouth daily with breakfast.  Dispense: 7 tablet; Refill: 0 - fluticasone (FLONASE) 50 MCG/ACT nasal spray; Place 2 sprays into both nostrils daily.  Dispense: 16 g; Refill: 6   Shirline Frees, NP

## 2023-12-09 DIAGNOSIS — N5201 Erectile dysfunction due to arterial insufficiency: Secondary | ICD-10-CM | POA: Diagnosis not present

## 2023-12-09 DIAGNOSIS — R3912 Poor urinary stream: Secondary | ICD-10-CM | POA: Diagnosis not present

## 2023-12-09 DIAGNOSIS — N401 Enlarged prostate with lower urinary tract symptoms: Secondary | ICD-10-CM | POA: Diagnosis not present

## 2023-12-09 DIAGNOSIS — R351 Nocturia: Secondary | ICD-10-CM | POA: Diagnosis not present

## 2024-01-02 ENCOUNTER — Encounter: Payer: Self-pay | Admitting: Gastroenterology

## 2024-01-27 DIAGNOSIS — D225 Melanocytic nevi of trunk: Secondary | ICD-10-CM | POA: Diagnosis not present

## 2024-01-27 DIAGNOSIS — D2272 Melanocytic nevi of left lower limb, including hip: Secondary | ICD-10-CM | POA: Diagnosis not present

## 2024-01-27 DIAGNOSIS — L814 Other melanin hyperpigmentation: Secondary | ICD-10-CM | POA: Diagnosis not present

## 2024-01-27 DIAGNOSIS — L821 Other seborrheic keratosis: Secondary | ICD-10-CM | POA: Diagnosis not present

## 2024-01-27 DIAGNOSIS — D485 Neoplasm of uncertain behavior of skin: Secondary | ICD-10-CM | POA: Diagnosis not present

## 2024-01-27 DIAGNOSIS — L570 Actinic keratosis: Secondary | ICD-10-CM | POA: Diagnosis not present

## 2024-03-11 DIAGNOSIS — Z96642 Presence of left artificial hip joint: Secondary | ICD-10-CM | POA: Diagnosis not present

## 2024-03-11 DIAGNOSIS — M25552 Pain in left hip: Secondary | ICD-10-CM | POA: Diagnosis not present
